# Patient Record
Sex: Female | Born: 1950 | Race: White | Hispanic: No | Marital: Single | Smoking: Never smoker
Health system: Southern US, Community
[De-identification: ages and names within clinical notes are randomized; demographics above are authoritative.]

## PROBLEM LIST (undated history)

## (undated) DIAGNOSIS — M810 Age-related osteoporosis without current pathological fracture: Secondary | ICD-10-CM

## (undated) HISTORY — DX: Age-related osteoporosis without current pathological fracture: M81.0

---

## 1999-02-14 ENCOUNTER — Other Ambulatory Visit: Admission: RE | Admit: 1999-02-14 | Discharge: 1999-02-14 | Payer: Self-pay | Admitting: *Deleted

## 2000-03-20 ENCOUNTER — Other Ambulatory Visit: Admission: RE | Admit: 2000-03-20 | Discharge: 2000-03-20 | Payer: Self-pay | Admitting: *Deleted

## 2001-05-13 ENCOUNTER — Ambulatory Visit (HOSPITAL_COMMUNITY): Admission: RE | Admit: 2001-05-13 | Discharge: 2001-05-13 | Payer: Self-pay | Admitting: *Deleted

## 2001-05-13 ENCOUNTER — Encounter: Payer: Self-pay | Admitting: *Deleted

## 2002-04-20 ENCOUNTER — Other Ambulatory Visit: Admission: RE | Admit: 2002-04-20 | Discharge: 2002-04-20 | Payer: Self-pay | Admitting: *Deleted

## 2005-08-06 ENCOUNTER — Ambulatory Visit (HOSPITAL_COMMUNITY): Admission: RE | Admit: 2005-08-06 | Discharge: 2005-08-06 | Payer: Self-pay | Admitting: Obstetrics

## 2016-04-03 ENCOUNTER — Ambulatory Visit: Payer: Self-pay | Admitting: Certified Nurse Midwife

## 2016-04-03 ENCOUNTER — Ambulatory Visit (INDEPENDENT_AMBULATORY_CARE_PROVIDER_SITE_OTHER): Payer: BLUE CROSS/BLUE SHIELD | Admitting: Certified Nurse Midwife

## 2016-04-03 ENCOUNTER — Encounter: Payer: Self-pay | Admitting: Certified Nurse Midwife

## 2016-04-03 VITALS — BP 129/64 | HR 65 | Temp 98.6°F | Wt 102.0 lb

## 2016-04-03 DIAGNOSIS — Z1389 Encounter for screening for other disorder: Secondary | ICD-10-CM

## 2016-04-03 DIAGNOSIS — M810 Age-related osteoporosis without current pathological fracture: Secondary | ICD-10-CM

## 2016-04-03 DIAGNOSIS — Z01419 Encounter for gynecological examination (general) (routine) without abnormal findings: Secondary | ICD-10-CM

## 2016-04-03 LAB — POCT URINALYSIS DIPSTICK
Bilirubin, UA: NEGATIVE
Blood, UA: NEGATIVE
Glucose, UA: NEGATIVE
Ketones, UA: NEGATIVE
Leukocytes, UA: NEGATIVE
Nitrite, UA: NEGATIVE
Protein, UA: NEGATIVE
Spec Grav, UA: <=1.005
Urobilinogen, UA: NEGATIVE
pH, UA: 8

## 2016-04-03 NOTE — Progress Notes (Signed)
Patient ID: Misty Baird, female   DOB: 1951/11/07, 65 y.o.   MRN: [id number]    Subjective:      Misty Baird is a 65 y.o. female here for a routine exam.  Current complaints: none.  Employed full time.  Post-menopausal April 15th, 2003. Had irritability with HRT.  Exercises regularly.  Does have osteoporosis, well controlled with diet and exercise.  Not currently sexually active.    Personal health questionnaire:  Is patient [name], have a family history of breast and/or ovarian cancer: unsure ?MGM BCA Is there a family history of uterine cancer diagnosed at age < 23, gastrointestinal cancer, urinary tract cancer, family member who is a Field seismologist syndrome-associated carrier: no Is the patient overweight and hypertensive, family history of diabetes, personal history of gestational diabetes, preeclampsia or PCOS: no Is patient over 52, have PCOS,  family history of premature CHD under age 67, diabetes, smoke, have hypertension or peripheral artery disease:  yes At any time, has a partner hit, kicked or otherwise hurt or frightened you?: no Over the past 2 weeks, have you felt down, depressed or hopeless?: no Over the past 2 weeks, have you felt little interest or pleasure in doing things?:no   Gynecologic History No LMP recorded. Patient is postmenopausal. Contraception: abstinence and post menopausal status Last Pap: roughly 11 years ago. Results were: normal according to the patient Last mammogram: unknown ?11 years ago. Results were: normal according to the patient.   Obstetric History OB History  No data available    Past Medical History  Diagnosis Date  . Osteoporosis     R Hip    Past Surgical History  Procedure Laterality Date  . Cesarean section      38 yrs ago     Current outpatient prescriptions:  .  aspirin 81 MG tablet, Take 81 mg by mouth every other day., Disp: , Rfl:  No Known Allergies  Social History  Substance Use Topics  . Smoking status: Never  Smoker   . Smokeless tobacco: Not on file  . Alcohol Use: 0.0 oz/week    0 Standard drinks or equivalent per week     Comment: Occasionally    Family History  Problem Relation Age of Onset  . Other Mother     Septic Bowel  . Cancer Maternal Grandmother   . Stroke Maternal Grandfather       Review of Systems  Constitutional: negative for fatigue and weight loss Respiratory: negative for cough and wheezing Cardiovascular: negative for chest pain, fatigue and palpitations Gastrointestinal: negative for abdominal pain and change in bowel habits Musculoskeletal:negative for myalgias Neurological: negative for gait problems and tremors Behavioral/Psych: negative for abusive relationship, depression Endocrine: negative for temperature intolerance   Genitourinary:negative for abnormal menstrual periods, genital lesions, hot flashes, sexual problems and vaginal discharge Integument/breast: negative for breast lump, breast tenderness, nipple discharge and skin lesion(s)    Objective:       BP 129/64 mmHg  Pulse 65  Temp(Src) 98.6 F (37 C)  Wt 102 lb (46.267 kg) General:   alert  Skin:   no rash or abnormalities  Lungs:   clear to auscultation bilaterally  Heart:   regular rate and rhythm, S1, S2 normal, no murmur, click, rub or gallop  Breasts:   normal without suspicious masses, skin or nipple changes or axillary nodes  Abdomen:  normal findings: no organomegaly, soft, non-tender and no hernia  Pelvis:  External genitalia: normal general appearance Urinary system: urethral meatus normal  and bladder without fullness, nontender Vaginal: normal without tenderness, induration or masses, no ruguae Cervix: normal appearance, atrophy Adnexa: normal bimanual exam Uterus: anteverted and non-tender, normal size   Lab Review Urine pregnancy test Labs reviewed yes Radiologic studies reviewed no  50% of 30 min visit spent on counseling and coordination of care.   Assessment:     Healthy female exam.   Post menopausal  Plan:    Education reviewed: calcium supplements, depression evaluation, low fat, low cholesterol diet, safe sex/STD prevention, self breast exams, skin cancer screening and weight bearing exercise. Contraception: post menopausal status. Mammogram ordered.  Colonoscopy ordered.   Up to date on bone scan.   >27 years of age, no hx of abnormal pap smears, discharged from GYN care as long as Pap smear is normal.    Meds ordered this encounter  Medications  . aspirin 81 MG tablet    Sig: Take 81 mg by mouth every other day.   Orders Placed This Encounter  Procedures  . MM DIGITAL SCREENING BILATERAL    Standing Status: Future     Number of Occurrences:      Standing Expiration Date: 06/03/2017    Order Specific Question:  Reason for Exam (SYMPTOM  OR DIAGNOSIS REQUIRED)    Answer:  annual exam    Order Specific Question:  Preferred imaging location?    Answer:  [facility]  . NuSwab Vaginitis (VG)  . Ambulatory referral to Gastroenterology    Referral Priority:  Routine    Referral Type:  Consultation    Referral Reason:  Specialty Services Required    Number of Visits Requested:  1  . Ambulatory referral to Internal Medicine    Referral Priority:  Routine    Referral Type:  Consultation    Referral Reason:  Specialty Services Required    Requested Specialty:  Internal Medicine    Number of Visits Requested:  1  . POCT urinalysis dipstick   Need to obtain previous records

## 2016-04-04 DIAGNOSIS — M810 Age-related osteoporosis without current pathological fracture: Secondary | ICD-10-CM | POA: Insufficient documentation

## 2016-04-07 LAB — NUSWAB VAGINITIS (VG)
CANDIDA GLABRATA, NAA: NEGATIVE
Candida albicans, NAA: NEGATIVE
Trich vag by NAA: NEGATIVE

## 2016-04-08 LAB — PAP IG AND HPV HIGH-RISK: PAP Smear Comment: 0

## 2016-04-08 LAB — HPV, LOW VOLUME (REFLEX): HPV, LOW VOL REFLEX: NEGATIVE

## 2016-04-24 ENCOUNTER — Ambulatory Visit
Admission: RE | Admit: 2016-04-24 | Discharge: 2016-04-24 | Disposition: A | Discharge status: Home / Self Care | Payer: BLUE CROSS/BLUE SHIELD | Source: Ambulatory Visit | Attending: Certified Nurse Midwife | Admitting: Certified Nurse Midwife

## 2016-04-24 DIAGNOSIS — Z01419 Encounter for gynecological examination (general) (routine) without abnormal findings: Secondary | ICD-10-CM

## 2016-05-05 ENCOUNTER — Encounter: Payer: Self-pay | Admitting: Certified Nurse Midwife

## 2016-06-23 ENCOUNTER — Encounter: Payer: Self-pay | Admitting: Internal Medicine

## 2016-06-23 ENCOUNTER — Ambulatory Visit (INDEPENDENT_AMBULATORY_CARE_PROVIDER_SITE_OTHER): Payer: BLUE CROSS/BLUE SHIELD | Admitting: Internal Medicine

## 2016-06-23 VITALS — BP 130/64 | HR 67 | Temp 98.3°F | Resp 16 | Ht 60.0 in | Wt 103.0 lb

## 2016-06-23 DIAGNOSIS — Z1159 Encounter for screening for other viral diseases: Secondary | ICD-10-CM

## 2016-06-23 DIAGNOSIS — Z0000 Encounter for general adult medical examination without abnormal findings: Secondary | ICD-10-CM | POA: Diagnosis not present

## 2016-06-23 DIAGNOSIS — D333 Benign neoplasm of cranial nerves: Secondary | ICD-10-CM | POA: Insufficient documentation

## 2016-06-23 DIAGNOSIS — M810 Age-related osteoporosis without current pathological fracture: Secondary | ICD-10-CM | POA: Diagnosis not present

## 2016-06-23 NOTE — Progress Notes (Signed)
Pre visit review using our clinic review tool, if applicable. No additional management support is needed unless otherwise documented below in the visit note. 

## 2016-06-23 NOTE — Assessment & Plan Note (Signed)
Recheck dexa Check vitamin d level Advised goal of 1200 mg of calcium daily Stressed exercise Discussed concern with OP and risk of fracture now and in future

## 2016-06-23 NOTE — Assessment & Plan Note (Signed)
Discussed that she should have repeat imaging to re-evaluate the tumor.  Advised that it is slow growing, but some people do need surgery She declined surgery at this time, but will think about it

## 2016-06-23 NOTE — Patient Instructions (Signed)
Test(s) ordered today. Your results will be released to [place] (or called to you) after review, usually within 72hours after test completion. If any changes need to be made, you will be notified at that same time.  All other Health Maintenance issues reviewed.   All recommended immunizations and age-appropriate screenings are up-to-date or discussed.  No immunizations administered today.   Medications reviewed and updated.  No changes recommended at this time.   A dexa was ordered  Please followup in one year for a physical exam.    Health Maintenance, Female Adopting a healthy lifestyle and getting preventive care can go a long way to promote health and wellness. Talk with your health care provider about what schedule of regular examinations is right for you. This is a good chance for you to check in with your provider about disease prevention and staying healthy. In between checkups, there are plenty of things you can do on your own. Experts have done a lot of research about which lifestyle changes and preventive measures are most likely to keep you healthy. Ask your health care provider for more information. WEIGHT AND DIET  Eat a healthy diet  Be sure to include plenty of vegetables, fruits, low-fat dairy products, and lean protein.  Do not eat a lot of foods high in solid fats, added sugars, or salt.  Get regular exercise. This is one of the most important things you can do for your health.  Most adults should exercise for at least 150 minutes each week. The exercise should increase your heart rate and make you sweat (moderate-intensity exercise).  Most adults should also do strengthening exercises at least twice a week. This is in addition to the moderate-intensity exercise.  Maintain a healthy weight  Body mass index (BMI) is a measurement that can be used to identify possible weight problems. It estimates body fat based on height and weight. Your health care provider can  help determine your BMI and help you achieve or maintain a healthy weight.  For females 87 years of age and older:   A BMI below 18.5 is considered underweight.  A BMI of 18.5 to 24.9 is normal.  A BMI of 25 to 29.9 is considered overweight.  A BMI of 30 and above is considered obese.  Watch levels of cholesterol and blood lipids  You should start having your blood tested for lipids and cholesterol at 67 years of age, then have this test every 5 years.  You may need to have your cholesterol levels checked more often if:  Your lipid or cholesterol levels are high.  You are older than 67 years of age.  You are at high risk for heart disease.  CANCER SCREENING   Lung Cancer  Lung cancer screening is recommended for adults 56-3 years old who are at high risk for lung cancer because of a history of smoking.  A yearly low-dose CT scan of the lungs is recommended for people who:  Currently smoke.  Have quit within the past 15 years.  Have at least a 30-pack-year history of smoking. A pack year is smoking an average of one pack of cigarettes a day for 1 year.  Yearly screening should continue until it has been 15 years since you quit.  Yearly screening should stop if you develop a health problem that would prevent you from having lung cancer treatment.  Breast Cancer  Practice breast self-awareness. This means understanding how your breasts normally appear and feel.  It also  means doing regular breast self-exams. Let your health care provider know about any changes, no matter how small.  If you are in your 20s or 30s, you should have a clinical breast exam (CBE) by a health care provider every 1-3 years as part of a regular health exam.  If you are 32 or older, have a CBE every year. Also consider having a breast X-ray (mammogram) every year.  If you have a family history of breast cancer, talk to your health care provider about genetic screening.  If you are at high  risk for breast cancer, talk to your health care provider about having an MRI and a mammogram every year.  Breast cancer gene (BRCA) assessment is recommended for women who have family members with BRCA-related cancers. BRCA-related cancers include:  Breast.  Ovarian.  Tubal.  Peritoneal cancers.  Results of the assessment will determine the need for genetic counseling and BRCA1 and BRCA2 testing. Cervical Cancer Your health care provider may recommend that you be screened regularly for cancer of the pelvic organs (ovaries, uterus, and vagina). This screening involves a pelvic examination, including checking for microscopic changes to the surface of your cervix (Pap test). You may be encouraged to have this screening done every 3 years, beginning at age 25.  For women ages 108-65, health care providers may recommend pelvic exams and Pap testing every 3 years, or they may recommend the Pap and pelvic exam, combined with testing for human papilloma virus (HPV), every 5 years. Some types of HPV increase your risk of cervical cancer. Testing for HPV may also be done on women of any age with unclear Pap test results.  Other health care providers may not recommend any screening for nonpregnant women who are considered low risk for pelvic cancer and who do not have symptoms. Ask your health care provider if a screening pelvic exam is right for you.  If you have had past treatment for cervical cancer or a condition that could lead to cancer, you need Pap tests and screening for cancer for at least 20 years after your treatment. If Pap tests have been discontinued, your risk factors (such as having a new sexual partner) need to be reassessed to determine if screening should resume. Some women have medical problems that increase the chance of getting cervical cancer. In these cases, your health care provider may recommend more frequent screening and Pap tests. Colorectal Cancer  This type of cancer can  be detected and often prevented.  Routine colorectal cancer screening usually begins at 67 years of age and continues through 67 years of age.  Your health care provider may recommend screening at an earlier age if you have risk factors for colon cancer.  Your health care provider may also recommend using home test kits to check for hidden blood in the stool.  A small camera at the end of a tube can be used to examine your colon directly (sigmoidoscopy or colonoscopy). This is done to check for the earliest forms of colorectal cancer.  Routine screening usually begins at age 107.  Direct examination of the colon should be repeated every 5-10 years through 67 years of age. However, you may need to be screened more often if early forms of precancerous polyps or small growths are found. Skin Cancer  Check your skin from head to toe regularly.  Tell your health care provider about any new moles or changes in moles, especially if there is a change in a mole's shape  or color.  Also tell your health care provider if you have a mole that is larger than the size of a pencil eraser.  Always use sunscreen. Apply sunscreen liberally and repeatedly throughout the day.  Protect yourself by wearing long sleeves, pants, a wide-brimmed hat, and sunglasses whenever you are outside. HEART DISEASE, DIABETES, AND HIGH BLOOD PRESSURE   High blood pressure causes heart disease and increases the risk of stroke. High blood pressure is more likely to develop in:  People who have blood pressure in the high end of the normal range (130-139/85-89 mm Hg).  People who are overweight or obese.  People who are African American.  If you are 58-28 years of age, have your blood pressure checked every 3-5 years. If you are 33 years of age or older, have your blood pressure checked every year. You should have your blood pressure measured twice--once when you are at a hospital or clinic, and once when you are not at a  hospital or clinic. Record the average of the two measurements. To check your blood pressure when you are not at a hospital or clinic, you can use:  An automated blood pressure machine at a pharmacy.  A home blood pressure monitor.  If you are between 17 years and 64 years old, ask your health care provider if you should take aspirin to prevent strokes.  Have regular diabetes screenings. This involves taking a blood sample to check your fasting blood sugar level.  If you are at a normal weight and have a low risk for diabetes, have this test once every three years after 67 years of age.  If you are overweight and have a high risk for diabetes, consider being tested at a younger age or more often. PREVENTING INFECTION  Hepatitis B  If you have a higher risk for hepatitis B, you should be screened for this virus. You are considered at high risk for hepatitis B if:  You were born in a country where hepatitis B is common. Ask your health care provider which countries are considered high risk.  Your parents were born in a high-risk country, and you have not been immunized against hepatitis B (hepatitis B vaccine).  You have HIV or AIDS.  You use needles to inject street drugs.  You live with someone who has hepatitis B.  You have had sex with someone who has hepatitis B.  You get hemodialysis treatment.  You take certain medicines for conditions, including cancer, organ transplantation, and autoimmune conditions. Hepatitis C  Blood testing is recommended for:  Everyone born from [birth year] through 1967.  Anyone with known risk factors for hepatitis C. Sexually transmitted infections (STIs)  You should be screened for sexually transmitted infections (STIs) including gonorrhea and chlamydia if:  You are sexually active and are younger than 67 years of age.  You are older than 67 years of age and your health care provider tells you that you are at risk for this type of  infection.  Your sexual activity has changed since you were last screened and you are at an increased risk for chlamydia or gonorrhea. Ask your health care provider if you are at risk.  If you do not have HIV, but are at risk, it may be recommended that you take a prescription medicine daily to prevent HIV infection. This is called pre-exposure prophylaxis (PrEP). You are considered at risk if:  You are sexually active and do not regularly use condoms or know the HIV  status of your partner(s).  You take drugs by injection.  You are sexually active with a partner who has HIV. Talk with your health care provider about whether you are at high risk of being infected with HIV. If you choose to begin PrEP, you should first be tested for HIV. You should then be tested every 3 months for as long as you are taking PrEP.  PREGNANCY   If you are premenopausal and you may become pregnant, ask your health care provider about preconception counseling.  If you may become pregnant, take 400 to 800 micrograms (mcg) of folic acid every day.  If you want to prevent pregnancy, talk to your health care provider about birth control (contraception). OSTEOPOROSIS AND MENOPAUSE   Osteoporosis is a disease in which the bones lose minerals and strength with aging. This can result in serious bone fractures. Your risk for osteoporosis can be identified using a bone density scan.  If you are 57 years of age or older, or if you are at risk for osteoporosis and fractures, ask your health care provider if you should be screened.  Ask your health care provider whether you should take a calcium or vitamin D supplement to lower your risk for osteoporosis.  Menopause may have certain physical symptoms and risks.  Hormone replacement therapy may reduce some of these symptoms and risks. Talk to your health care provider about whether hormone replacement therapy is right for you.  HOME CARE INSTRUCTIONS   Schedule regular  health, dental, and eye exams.  Stay current with your immunizations.   Do not use any tobacco products including cigarettes, chewing tobacco, or electronic cigarettes.  If you are pregnant, do not drink alcohol.  If you are breastfeeding, limit how much and how often you drink alcohol.  Limit alcohol intake to no more than 1 drink per day for nonpregnant women. One drink equals 12 ounces of beer, 5 ounces of wine, or 1 ounces of hard liquor.  Do not use street drugs.  Do not share needles.  Ask your health care provider for help if you need support or information about quitting drugs.  Tell your health care provider if you often feel depressed.  Tell your health care provider if you have ever been abused or do not feel safe at home.   This information is not intended to replace advice given to you by your health care provider. Make sure you discuss any questions you have with your health care provider.   Document Released: 06/02/2011 Document Revised: 12/08/2014 Document Reviewed: 10/19/2013 Elsevier Interactive Patient Education [employer].

## 2016-06-23 NOTE — Progress Notes (Signed)
Subjective:    Patient ID: Misty Baird, female    DOB: 07/04/1951, 65 y.o.   MRN: [id number]  HPI She is here to establish with a new pcp.  She is here for a physical exam.    She has not seen pcp in years.   OP:  Her last dexa was several years ago.  She took actonel for three months and it caused hip pain so she stopped it.  She is not taking calcium or vitamin d.  She is doing some exercise.    Acoustic neuroma:  She has a history of an acoustic neuroma that was diagnosed in 2002.  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  She has not had follow up imaging since 2002.    She has no concerns or questions.   Medications and allergies reviewed with patient and updated if appropriate.  Patient Active Problem List   Diagnosis Date Noted  . 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 Osteoporosis 04/04/2016    Current Outpatient Prescriptions on File Prior to Visit  Medication Sig Dispense Refill  . aspirin 81 MG tablet Take 81 mg by mouth every other day.     No current facility-administered medications on file prior to visit.     Past Medical History:  Diagnosis Date  . Osteoporosis    R Hip    Past Surgical History:  Procedure Laterality Date  . CESAREAN SECTION     38 yrs ago    Social History   Social History  . Marital status: Single    Spouse name: N/A  . Number of children: N/A  . Years of education: N/A   Social History Main Topics  . Smoking status: Never Smoker  . Smokeless tobacco: Never Used  . Alcohol use 0.0 oz/week     Comment: Occasionally  . Drug use: No  . Sexual activity: No   Other Topics Concern  . None   Social History Narrative  . None    Family History  Problem Relation Age of Onset  . Other Mother     Septic Bowel  . Cancer Maternal Grandmother   . Stroke Maternal Grandfather     Review of Systems  Constitutional: Negative for appetite change, chills, fatigue, fever and unexpected weight change.  Eyes:  Negative for visual disturbance.  Respiratory: Negative for cough, shortness of breath and wheezing.   Cardiovascular: Negative for chest pain, palpitations and leg swelling.  Gastrointestinal: Negative for abdominal pain, blood in stool, constipation, diarrhea and nausea.       Rare GERD  Genitourinary: Negative for dysuria and hematuria.  Musculoskeletal: Negative for arthralgias and back pain.  Skin: Negative for color change and rash.  Neurological: Negative for dizziness, light-headedness and headaches.  Psychiatric/Behavioral: Negative for dysphoric mood. The patient is not nervous/anxious.        Objective:   Vitals:   06/23/16 1509  BP: 130/64  Pulse: 67  Resp: 16  Temp: 98.3 F (36.8 C)   Filed Weights   06/23/16 1509  Weight: 103 lb (46.7 kg)   Body mass index is 20.12 kg/m.   Physical Exam Constitutional: She appears well-developed and well-nourished. No distress.  HENT:  Head: Normocephalic and atraumatic.  Right Ear: External ear normal. Normal ear canal and TM Left Ear: External ear normal.  Normal ear canal and TM Mouth/Throat: Oropharynx is clear and moist.  Eyes: Conjunctivae and EOM are normal.  Neck: Neck supple. No tracheal  deviation present. No thyromegaly present.  No carotid bruit  Cardiovascular: Normal rate, regular rhythm and normal heart sounds.   No murmur heard.  No edema. Pulmonary/Chest: Effort normal and breath sounds normal. No respiratory distress. She has no wheezes. She has no rales.  Breast: deferred to Gyn Abdominal: Soft. She exhibits no distension. There is no tenderness.  Lymphadenopathy: She has no cervical adenopathy.  Skin: Skin is warm and dry. She is not diaphoretic.  Psychiatric: She has a normal mood and affect. Her behavior is normal.       Assessment & Plan:   Physical exam: Screening blood work ordered Immunizations - discussed vaccines - she deferred them today Colonoscopy - never had one - deferred today by  patient Mammogram  Up to date  Gyn  Up to date  Dexa ordered Eye exams  Up to date  Exercise - exercising Weight  - normal BMI Skin - no concerns Substance abuse  - none  See Problem List for Assessment and Plan of chronic medical problems.

## 2016-08-01 ENCOUNTER — Ambulatory Visit (INDEPENDENT_AMBULATORY_CARE_PROVIDER_SITE_OTHER)
Admission: RE | Admit: 2016-08-01 | Discharge: 2016-08-01 | Disposition: A | Discharge status: Home / Self Care | Payer: BLUE CROSS/BLUE SHIELD | Source: Ambulatory Visit | Attending: Internal Medicine | Admitting: Internal Medicine

## 2016-08-01 DIAGNOSIS — M810 Age-related osteoporosis without current pathological fracture: Secondary | ICD-10-CM

## 2016-08-05 DIAGNOSIS — M810 Age-related osteoporosis without current pathological fracture: Secondary | ICD-10-CM | POA: Diagnosis not present

## 2016-08-08 ENCOUNTER — Encounter: Payer: Self-pay | Admitting: Physician Assistant

## 2016-08-08 ENCOUNTER — Ambulatory Visit (INDEPENDENT_AMBULATORY_CARE_PROVIDER_SITE_OTHER): Payer: BLUE CROSS/BLUE SHIELD

## 2016-08-08 ENCOUNTER — Ambulatory Visit (INDEPENDENT_AMBULATORY_CARE_PROVIDER_SITE_OTHER): Payer: BLUE CROSS/BLUE SHIELD | Admitting: Family Medicine

## 2016-08-08 VITALS — BP 130/70 | HR 68 | Temp 98.0°F | Resp 16 | Ht 60.0 in | Wt 101.8 lb

## 2016-08-08 DIAGNOSIS — M792 Neuralgia and neuritis, unspecified: Secondary | ICD-10-CM

## 2016-08-08 DIAGNOSIS — Z79899 Other long term (current) drug therapy: Secondary | ICD-10-CM | POA: Diagnosis not present

## 2016-08-08 DIAGNOSIS — M549 Dorsalgia, unspecified: Secondary | ICD-10-CM

## 2016-08-08 DIAGNOSIS — M546 Pain in thoracic spine: Secondary | ICD-10-CM

## 2016-08-08 LAB — GLUCOSE, POCT (MANUAL RESULT ENTRY): POC Glucose: 92 mg/dl (ref 70–99)

## 2016-08-08 MED ORDER — PREDNISONE 20 MG PO TABS: ORAL_TABLET | ORAL | 0 refills | Status: DC

## 2016-08-08 MED ORDER — HYDROCODONE-ACETAMINOPHEN 5-325 MG PO TABS: 1.0000 | ORAL_TABLET | Freq: Four times a day (QID) | ORAL | 0 refills | Status: DC | PRN

## 2016-08-08 NOTE — Progress Notes (Addendum)
Subjective:  By signing my name below, I, Moises Blood, attest that this documentation has been prepared under the direction and in the presence of Merri Ray, MD. Electronically Signed: Moises Blood, [place]. 08/08/2016 , 8:56 AM .  Patient was seen in Room 10 .   Patient ID: Misty Baird, female    DOB: 06-29-1951, 65 y.o.   MRN: [id number] Chief Complaint  Patient presents with  . Shoulder Pain    SINCE LAST SATURDAY   HPI Misty Baird is a 65 y.o. female  Here for constant left shoulder pain that started 6 days ago. Patient states she was playing with her grandchildren 6 days ago and may have picked them up wrong. She notes the pain located under her left shoulder blade and has progressively worsened. She describes the pain being "a hot poker under her left shoulder blade" with intermittent pain radiating into her neck and down her left arm to her hand. She's been alternating between 2 tablets of ibuprofen and aspirin every 4~6 hours with some relief. She denies similar injury or pain in the past. She denies prior surgery to the area. She denies shortness of breath, chest pain, chest tightness, or leg swelling. She denies history of DVT's. She denies history of shoulder fracture. She denies any recent long distance travel.   She's right hand dominant.  She had a bone density done last week and was informed to have osteoporosis in her hip.   Patient Active Problem List   Diagnosis Date Noted  . 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 Osteoporosis 04/04/2016   Past Medical History:  Diagnosis Date  . Osteoporosis    R Hip   Past Surgical History:  Procedure Laterality Date  . CESAREAN SECTION     38 yrs ago   No Known Allergies Prior to Admission medications   Medication Sig Start Date End Date Taking? Authorizing Provider  aspirin 325 MG EC tablet Take 325 mg by mouth daily.   Yes Historical Provider, MD  aspirin 81 MG tablet Take 81 mg by mouth every other day.     Historical Provider, MD   Social History   Social History  . Marital status: Single    Spouse name: N/A  . Number of children: N/A  . Years of education: N/A   Occupational History  . Not on file.   Social History Main Topics  . Smoking status: Never Smoker  . Smokeless tobacco: Never Used  . Alcohol use 0.0 oz/week     Comment: Occasionally  . Drug use: No  . Sexual activity: No   Other Topics Concern  . Not on file   Social History Narrative  . No narrative on file   Review of Systems  Constitutional: Negative for chills, fatigue, fever and unexpected weight change.  Respiratory: Negative for cough, chest tightness and shortness of breath.   Cardiovascular: Negative for chest pain and leg swelling.  Gastrointestinal: Negative for constipation, diarrhea, nausea and vomiting.  Musculoskeletal: Positive for arthralgias, myalgias and neck pain.  Skin: Negative for rash and wound.  Neurological: Negative for dizziness, weakness and headaches.       Objective:   Physical Exam  Constitutional: She is oriented to person, place, and time. She appears well-developed and well-nourished. No distress.  HENT:  Head: Normocephalic and atraumatic.  Eyes: EOM are normal. Pupils are equal, round, and reactive to light.  Neck: Neck supple.  Cardiovascular: Normal rate, regular rhythm and normal heart sounds.  Exam  reveals no gallop and no friction rub.   No murmur heard. Pulmonary/Chest: Effort normal and breath sounds normal. No respiratory distress.  Musculoskeletal: Normal range of motion.  Unable to reproduce pain with c-spine ROM, tenderness along lower rhomboid in her back, no focal bony tenderness, shoulder full ROM, full RTC strength, negative neers, negative hawkin's, negative empty can  Neurological: She is alert and oriented to person, place, and time.  Skin: Skin is warm and dry.  Psychiatric: She has a normal mood and affect. Her behavior is normal.  Nursing note and  vitals reviewed.   Vitals:   08/08/16 0829  BP: 130/70  Pulse: 68  Resp: 16  Temp: 98 F (36.7 C)  TempSrc: Oral  SpO2: 99%  Weight: 101 lb 12.8 oz (46.2 kg)  Height: 5' (1.524 m)    Results for orders placed or performed in visit on 08/08/16  POCT glucose (manual entry)  Result Value Ref Range   POC Glucose 92 70 - 99 mg/dl    Dg Chest 2 View  Result Date: 08/08/2016 CLINICAL DATA:  Left-sided chest pain. EXAM: CHEST  2 VIEW COMPARISON:  None. FINDINGS: The heart size and mediastinal contours are within normal limits. Both lungs are clear. No pneumothorax or pleural effusion is noted. The visualized skeletal structures are unremarkable. IMPRESSION: No active cardiopulmonary disease. Electronically Signed   By: Marijo Conception, M.D.   On: 08/08/2016 09:32   Dg Cervical Spine 2 Or 3 Views  Result Date: 08/08/2016 CLINICAL DATA:  Neck pain EXAM: CERVICAL SPINE - 2-3 VIEW COMPARISON:  None. FINDINGS: Degenerative disc disease from C4-5 thru C6-7, most pronounced at C5-6 and C6-7 with disc space narrowing and spurring. Degenerative facet disease bilaterally. Degenerative anterolisthesis of C3 on C4 of 3 mm. No fracture. Prevertebral soft tissues are normal. IMPRESSION: Degenerative changes as above.  No acute findings. Electronically Signed   By: Rolm Baptise M.D.   On: 08/08/2016 09:30       Assessment & Plan:    Misty Baird is a 66 y.o. female Upper back pain on left side - Plan: DG Chest 2 View, DG Cervical Spine 2 or 3 views, predniSONE (DELTASONE) 20 MG tablet, HYDROcodone-acetaminophen (NORCO/VICODIN) 5-325 MG tablet  Radicular pain in left arm - Plan: DG Chest 2 View, DG Cervical Spine 2 or 3 views, predniSONE (DELTASONE) 20 MG tablet, HYDROcodone-acetaminophen (NORCO/VICODIN) 5-325 MG tablet  High risk medication use - Plan: POCT glucose (manual entry)  Suspected cervical radiculopathy with underlying degenerative disc disease. Strength and reflexes intact.   -Initial  trial of prednisone as she has been taking NSAIDs without relief for the past week. Side effects discussed, hydrocodone if needed every 6 hours. Side effects discussed.   -Out of work note for today, recheck within the next 1 week if not improving, sooner if worse. Next step would possibly be MRI or orthopedic eval.  Meds ordered this encounter  Medications  . aspirin 325 MG EC tablet    Sig: Take 325 mg by mouth daily.  . predniSONE (DELTASONE) 20 MG tablet    Sig: 3 tabs by mouth each day for 2 days, 2 tabs by mouth each day for 2 days, 1 tab by mouth each day for 2 days, 1/2 tab by mouth each day for 2 days.    Dispense:  13 tablet    Refill:  0  . HYDROcodone-acetaminophen (NORCO/VICODIN) 5-325 MG tablet    Sig: Take 1 tablet by mouth every 6 (six) hours  as needed for moderate pain.    Dispense:  15 tablet    Refill:  0   Patient Instructions    Start prednisone for possible pinched nerve from your neck. Hydrocodone up to every 6 hours as needed. Do not drive or operate machinery while taking this medication. If you're not improving within the next 5-7 days, I would recommend an MRI of your neck, or evaluation with orthopedist. Follow up sooner if worse.   Return to the clinic or go to the nearest emergency room if any of your symptoms worsen or new symptoms occur.   Cervical Radiculopathy Cervical radiculopathy happens when a nerve in the neck (cervical nerve) is pinched or bruised. This condition can develop because of an injury or as part of the normal aging process. Pressure on the cervical nerves can cause pain or numbness that runs from the neck all the way down into the arm and fingers. Usually, this condition gets better with rest. Treatment may be needed if the condition does not improve.  CAUSES This condition may be caused by:  Injury.  Slipped (herniated) disk.  Muscle tightness in the neck because of overuse.  Arthritis.  Breakdown or degeneration in the bones  and joints of the spine (spondylosis) due to aging.  Bone spurs that may develop near the cervical nerves. SYMPTOMS Symptoms of this condition include:  Pain that runs from the neck to the arm and hand. The pain can be severe or irritating. It may be worse when the neck is moved.  Numbness or weakness in the affected arm and hand. DIAGNOSIS This condition may be diagnosed based on symptoms, medical history, and a physical exam. You may also have tests, including:  X-rays.  CT scan.  MRI.  Electromyogram (EMG).  Nerve conduction tests. TREATMENT In many cases, treatment is not needed for this condition. With rest, the condition usually gets better over time. If treatment is needed, options may include:  Wearing a soft neck collar for short periods of time.  Physical therapy to strengthen your neck muscles.  Medicines, such as NSAIDs, oral corticosteroids, or spinal injections.  Surgery. This may be needed if other treatments do not help. Various types of surgery may be done depending on the cause of your problems. HOME CARE INSTRUCTIONS Managing Pain  Take over-the-counter and prescription medicines only as told by your health care provider.  If directed, apply ice to the affected area.  Put ice in a plastic bag.  Place a towel between your skin and the bag.  Leave the ice on for 20 minutes, 2-3 times per day.  If ice does not help, you can try using heat. Take a warm shower or warm bath, or use a heat pack as told by your health care provider.  Try a gentle neck and shoulder massage to help relieve symptoms. Activity  Rest as needed. Follow instructions from your health care provider about any restrictions on activities.  Do stretching and strengthening exercises as told by your health care provider or physical therapist. General Instructions  If you were given a soft collar, wear it as told by your health care provider.  Use a flat pillow when you  sleep.  Keep all follow-up visits as told by your health care provider. This is important. SEEK MEDICAL CARE IF:  Your condition does not improve with treatment. SEEK IMMEDIATE MEDICAL CARE IF:  Your pain gets much worse and cannot be controlled with medicines.  You have weakness or numbness  in your hand, arm, face, or leg.  You have a high fever.  You have a stiff, rigid neck.  You lose control of your bowels or your bladder (have incontinence).  You have trouble with walking, balance, or speaking.   This information is not intended to replace advice given to you by your health care provider. Make sure you discuss any questions you have with your health care provider.   Document Released: 08/12/2001 Document Revised: 08/08/2015 Document Reviewed: 01/11/2015 Elsevier Interactive Patient Education [employer].    IF you received an x-ray today, you will receive an invoice from [facility]. Please contact [facility] at [phone] with questions or concerns regarding your invoice.   IF you received labwork today, you will receive an invoice from [employer]. Please contact [facility] at [phone] with questions or concerns regarding your invoice.   Our billing staff will not be able to assist you with questions regarding bills from these companies.  You will be contacted with the lab results as soon as they are available. The fastest way to get your results is to activate your My Chart account. Instructions are located on the last page of this paperwork. If you have not heard from [place] regarding the results in 2 weeks, please contact this office.       I personally performed the services described in this documentation, which was scribed in my presence. The recorded information has been reviewed and considered, and addended by me as needed.   Signed,   Merri Ray, MD [facility].  08/08/16 9:55 AM

## 2016-08-08 NOTE — Patient Instructions (Addendum)
Start prednisone for possible pinched nerve from your neck. Hydrocodone up to every 6 hours as needed. Do not drive or operate machinery while taking this medication. If you're not improving within the next 5-7 days, I would recommend an MRI of your neck, or evaluation with orthopedist. Follow up sooner if worse.   Return to the clinic or go to the nearest emergency room if any of your symptoms worsen or new symptoms occur.   Cervical Radiculopathy Cervical radiculopathy happens when a nerve in the neck (cervical nerve) is pinched or bruised. This condition can develop because of an injury or as part of the normal aging process. Pressure on the cervical nerves can cause pain or numbness that runs from the neck all the way down into the arm and fingers. Usually, this condition gets better with rest. Treatment may be needed if the condition does not improve.  CAUSES This condition may be caused by:  Injury.  Slipped (herniated) disk.  Muscle tightness in the neck because of overuse.  Arthritis.  Breakdown or degeneration in the bones and joints of the spine (spondylosis) due to aging.  Bone spurs that may develop near the cervical nerves. SYMPTOMS Symptoms of this condition include:  Pain that runs from the neck to the arm and hand. The pain can be severe or irritating. It may be worse when the neck is moved.  Numbness or weakness in the affected arm and hand. DIAGNOSIS This condition may be diagnosed based on symptoms, medical history, and a physical exam. You may also have tests, including:  X-rays.  CT scan.  MRI.  Electromyogram (EMG).  Nerve conduction tests. TREATMENT In many cases, treatment is not needed for this condition. With rest, the condition usually gets better over time. If treatment is needed, options may include:  Wearing a soft neck collar for short periods of time.  Physical therapy to strengthen your neck muscles.  Medicines, such as NSAIDs, oral  corticosteroids, or spinal injections.  Surgery. This may be needed if other treatments do not help. Various types of surgery may be done depending on the cause of your problems. HOME CARE INSTRUCTIONS Managing Pain  Take over-the-counter and prescription medicines only as told by your health care provider.  If directed, apply ice to the affected area.  Put ice in a plastic bag.  Place a towel between your skin and the bag.  Leave the ice on for 20 minutes, 2-3 times per day.  If ice does not help, you can try using heat. Take a warm shower or warm bath, or use a heat pack as told by your health care provider.  Try a gentle neck and shoulder massage to help relieve symptoms. Activity  Rest as needed. Follow instructions from your health care provider about any restrictions on activities.  Do stretching and strengthening exercises as told by your health care provider or physical therapist. General Instructions  If you were given a soft collar, wear it as told by your health care provider.  Use a flat pillow when you sleep.  Keep all follow-up visits as told by your health care provider. This is important. SEEK MEDICAL CARE IF:  Your condition does not improve with treatment. SEEK IMMEDIATE MEDICAL CARE IF:  Your pain gets much worse and cannot be controlled with medicines.  You have weakness or numbness in your hand, arm, face, or leg.  You have a high fever.  You have a stiff, rigid neck.  You lose control of your  bowels or your bladder (have incontinence).  You have trouble with walking, balance, or speaking.   This information is not intended to replace advice given to you by your health care provider. Make sure you discuss any questions you have with your health care provider.   Document Released: 08/12/2001 Document Revised: 08/08/2015 Document Reviewed: 01/11/2015 Elsevier Interactive Patient Education [employer].    IF you received an x-ray today,  you will receive an invoice from [facility]. Please contact [facility] at [phone] with questions or concerns regarding your invoice.   IF you received labwork today, you will receive an invoice from [employer]. Please contact [facility] at [phone] with questions or concerns regarding your invoice.   Our billing staff will not be able to assist you with questions regarding bills from these companies.  You will be contacted with the lab results as soon as they are available. The fastest way to get your results is to activate your My Chart account. Instructions are located on the last page of this paperwork. If you have not heard from [place] regarding the results in 2 weeks, please contact this office.

## 2016-08-09 ENCOUNTER — Telehealth: Payer: Self-pay

## 2016-08-09 ENCOUNTER — Other Ambulatory Visit: Payer: Self-pay | Admitting: Radiology

## 2016-08-09 NOTE — Telephone Encounter (Signed)
I have discussed with patient/ and her daughter. Advised to d/c Hydrocodone, use Tylenol. Try moist heat to her neck/ upper back. Advised to give prednisone time to work, use Benadryl prn 25 mg, to see if this will help some since she has had trouble with hydrocodone making her "feel bad" Sedation warning provided with Benadryl. To you FYI

## 2016-08-09 NOTE — Telephone Encounter (Signed)
Pt was seen yesterday and the hydrocodone is making her sick And would like something different  Best number [phone]

## 2016-08-09 NOTE — Telephone Encounter (Signed)
Agree.  Stop hydrocodone, and tylenol is ok. Prednisone may start to work today or tomorrow. If still requiring stronger pain med into Monday, could consider tramadol with food.

## 2016-08-12 ENCOUNTER — Telehealth: Payer: Self-pay | Admitting: Emergency Medicine

## 2016-08-12 NOTE — Telephone Encounter (Signed)
Pt states, the pain is easing up with taking otc extra strength Arthritis pills. Declined Tramadol for now due to side effect after taking Vicodin. Instructed pt to call or return to clinic if pain worsens.

## 2016-08-19 ENCOUNTER — Encounter: Payer: Self-pay | Admitting: Internal Medicine

## 2016-08-19 ENCOUNTER — Ambulatory Visit (INDEPENDENT_AMBULATORY_CARE_PROVIDER_SITE_OTHER): Payer: BLUE CROSS/BLUE SHIELD | Admitting: Internal Medicine

## 2016-08-19 VITALS — BP 124/72 | HR 67 | Temp 98.0°F | Resp 16 | Wt 103.0 lb

## 2016-08-19 DIAGNOSIS — M549 Dorsalgia, unspecified: Secondary | ICD-10-CM | POA: Diagnosis not present

## 2016-08-19 DIAGNOSIS — M5010 Cervical disc disorder with radiculopathy, unspecified cervical region: Secondary | ICD-10-CM

## 2016-08-19 DIAGNOSIS — M810 Age-related osteoporosis without current pathological fracture: Secondary | ICD-10-CM | POA: Diagnosis not present

## 2016-08-19 MED ORDER — OXYCODONE-ACETAMINOPHEN 5-325 MG PO TABS: 1.0000 | ORAL_TABLET | Freq: Three times a day (TID) | ORAL | 0 refills | Status: AC | PRN

## 2016-08-19 MED ORDER — METHOCARBAMOL 500 MG PO TABS: 500.0000 mg | ORAL_TABLET | Freq: Three times a day (TID) | ORAL | 0 refills | Status: DC | PRN

## 2016-08-19 NOTE — Progress Notes (Signed)
Pre visit review using our clinic review tool, if applicable. No additional management support is needed unless otherwise documented below in the visit note. 

## 2016-08-19 NOTE — Assessment & Plan Note (Signed)
Her mid back pain seems muscular in nature We'll try muscle relaxer-metacarpal Oxycodone for severe pain only Continue ibuprofen Consider physical therapy Call if no improvement

## 2016-08-19 NOTE — Patient Instructions (Addendum)
Calcium citrate 600 mg twice a day with food.  Vitamin d 1000 unit daily.  Regular exercise is important to do, such as walking, zumba, pilates and weight lifting.    Let me know if you want to start the fosamax. It would be one a week.      Try the muscle relaxer - methocarbamol.    The pain medication use only as needed and take with food.   Call if no improvement.

## 2016-08-19 NOTE — Assessment & Plan Note (Signed)
Radiating pain down left arm associated with numbness/tingling and weakness-intermittent symptoms No significant neck pain We'll try muscle relaxer and oxycodone-acetaminophen Continue ibuprofen If no improvement recommended physical therapy and possibly imaging of her neck Advised her to call if there is no improvement

## 2016-08-19 NOTE — Assessment & Plan Note (Addendum)
Reviewed dexa Start calcium and vitamin d daily - advised amount Discussed treatment options - she will let me know Will consider fosamax Continue regular exercise Repeat DEXA in 2 years, sooner if she starts treatment

## 2016-08-19 NOTE — Progress Notes (Signed)
Subjective:    Patient ID: Misty Baird, female    DOB: [birth year], 65 y.o.   MRN: [id number]  HPI  She is here to discuss osteoporosis and is also still experiencing back and arm pain.  Cervical radiculopathy, left mid back pain: Her pain started the beginning of the month and worsened. She was seen at urgent care and diagnosed with cervical radiculopathy. She was prescribed a prednisone taper and Vicodin. She completed the prednisone and did not feel much improvement. She only took the Vicodin once and it caused her nausea and she vomited the medication. She has not tried it again. She is currently taking ibuprofen, which does not seem to be helping the pain. Ice has helped more than heat. She is still having significant pain, but it is intermittent. She has pain in the left mid back near the scapula. She has pain radiating down the left arm as well as numbness, tingling and weakness in the arm. All of her symptoms are intermittent. She denies any neck pain and has full range of motion of her neck. She denies shoulder pain and has full range of motion of her shoulder.  Osteoporosis: She has a history of osteoporosis and had a recent bone density scan. She was on Actonel, but only took a few times and stopped it due to hip pain. She has not been on any other medication. She is not currently taking any calcium or vitamin D. She typically exercises regularly, but has not been able to exercise as much with her neck pain. She denies any history of fractures.   Medications and allergies reviewed with patient and updated if appropriate.  Patient Active Problem List   Diagnosis Date Noted  . 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 Osteoporosis 04/04/2016    Current Outpatient Prescriptions on File Prior to Visit  Medication Sig Dispense Refill  . aspirin 325 MG EC tablet Take 325 mg by mouth daily.    Marland Kitchen aspirin 81 MG tablet Take 81 mg by mouth every other day.     No current  facility-administered medications on file prior to visit.     Past Medical History:  Diagnosis Date  . Osteoporosis    R Hip    Past Surgical History:  Procedure Laterality Date  . CESAREAN SECTION     38 yrs ago    Social History   Social History  . Marital status: Single    Spouse name: N/A  . Number of children: N/A  . Years of education: N/A   Social History Main Topics  . Smoking status: Never Smoker  . Smokeless tobacco: Never Used  . Alcohol use 0.0 oz/week     Comment: Occasionally  . Drug use: No  . Sexual activity: No   Other Topics Concern  . Not on file   Social History Narrative  . No narrative on file    Family History  Problem Relation Age of Onset  . Other Mother     Septic Bowel  . Cancer Maternal Grandmother   . Stroke Maternal Grandfather     Review of Systems  Musculoskeletal: Positive for myalgias. Negative for arthralgias, neck pain and neck stiffness.  Neurological: Positive for weakness and numbness. Negative for light-headedness and headaches.       Objective:   Vitals:   08/19/16 1535  BP: 124/72  Pulse: 67  Resp: 16  Temp: 98 F (36.7 C)   Filed Weights   08/19/16 1535  Weight:  103 lb (46.7 kg)   Body mass index is 20.12 kg/m.   Physical Exam  Constitutional: She appears well-developed and well-nourished. No distress.  Musculoskeletal:  No neck pain or left shoulder pain, full ROM of neck and left shoulder, pain with palpations left mid back   Neurological:  Normal sensation and strength in b/l UE  Skin: No rash noted. She is not diaphoretic.          Assessment & Plan:   See Problem List for Assessment and Plan of chronic medical problems.

## 2016-08-25 ENCOUNTER — Telehealth: Payer: Self-pay

## 2016-08-25 DIAGNOSIS — M25512 Pain in left shoulder: Secondary | ICD-10-CM

## 2016-08-25 DIAGNOSIS — M79602 Pain in left arm: Secondary | ICD-10-CM

## 2016-08-25 NOTE — Telephone Encounter (Signed)
Please advise 

## 2016-08-25 NOTE — Telephone Encounter (Signed)
Patient states she is still having a lot of pain to her L shoulder down her arm. She states Dr.Burns had said something about some PT and she would like to try it. She states she needs to do something. Please follow up, Thank you.

## 2016-08-25 NOTE — Telephone Encounter (Signed)
Referral ordered.  If no improvement she should let us know so we can refer to sports medicine/ortho

## 2016-08-27 ENCOUNTER — Telehealth: Payer: Self-pay | Admitting: Internal Medicine

## 2016-08-27 NOTE — Telephone Encounter (Signed)
Spoke with pt to inform. Pt states that the pain in "screaming" down her arm. She also is concerned bc she said she has the feeling in her throat like you do when you're trying not to cry. She would like to know if this could be coming from her having to constantly take tylenol. Please advise as Dr Quay Burow is not back until Monday.

## 2016-08-27 NOTE — Telephone Encounter (Signed)
PT order has been submitted and the physical therapy place will call her to schedule.    She should be seen if no improvement or worsening of her symptoms.

## 2016-08-27 NOTE — Telephone Encounter (Signed)
°  Patient Name: Misty Baird  DOB: Apr 19, 1951    Initial Comment Caller is taking muscle relaxer and is having a reaction    Nurse Assessment  Nurse: Julien Girt, RN, [name] Date/Time (Eastern Time): 08/27/2016 9:20:33 AM  Confirm and document reason for call. If symptomatic, describe symptoms. You must click the next button to save text entered. ---Caller states she is taking a muscle relaxer( Robaxin) and she is having a reaction . States her sx began 2 days, she describes a "lump in her throat". She did not take any more since Sunday. Her throat does not feel normal.  Has the patient traveled out of the country within the last 30 days? ---Not Applicable  Does the patient have any new or worsening symptoms? ---Yes  Will a triage be completed? ---Yes  Related visit to physician within the last 2 weeks? ---No  Does the PT have any chronic conditions? (i.e. diabetes, asthma, etc.) ---Yes  List chronic conditions. ---DJD Cervical  Is this a behavioral health or substance abuse call? ---No     Guidelines    Guideline Title Affirmed Question Affirmed Notes  Sore Throat [1] Sore throat is the only symptom AND [2] present > 48 hours    Final Disposition User   See Physician within [place], RN, [name]    Comments  [name] states she does not feel she needs to be seen , she has stopped the Robaxin and will see if her throat improves. Advised to cb as needed. Verbalized understanding. ** SHE IS AWAITING CB REGARDING PT AND CAN BE REACHED AT [phone]   Referrals  GO TO FACILITY REFUSED   Disagree/Comply: Disagree  Disagree/Comply Reason: Wait and see

## 2016-08-27 NOTE — Telephone Encounter (Signed)
Patient has stopped robaxin---do you recommend anything else----I will call patient back

## 2016-08-27 NOTE — Telephone Encounter (Signed)
Patient has already tried prednisone without any real good results---she's already had xrays---I have advised there's really no other medicine to replace what she's tried without being seen in office again, with possible CT scan---patient advised that PT referral is in place so someone from that office should be calling her if she wants to wait to see them---she's already been to urgent care---patient advised to call our office back if she wants to see our NP, charlotte---who will be seeing acute visits on Thursday and Friday, but dr burns is not in office rest of this week---if she sees charlotte, I still cannot guarantee that charlotte would want to have CT scan done, that decision would be left up to charlotte---patient wants to wait on PT to call, she does not want a visit right now, will call back if she changes her mind

## 2016-09-01 ENCOUNTER — Ambulatory Visit: Payer: BLUE CROSS/BLUE SHIELD | Attending: Internal Medicine | Admitting: Physical Therapy

## 2016-09-01 DIAGNOSIS — M25512 Pain in left shoulder: Secondary | ICD-10-CM | POA: Insufficient documentation

## 2016-09-01 DIAGNOSIS — R293 Abnormal posture: Secondary | ICD-10-CM | POA: Diagnosis present

## 2016-09-01 DIAGNOSIS — G8929 Other chronic pain: Secondary | ICD-10-CM | POA: Insufficient documentation

## 2016-09-01 NOTE — Therapy (Signed)
[place], [place], [address] Phone: [phone]   Fax:  [phone]  Physical Therapy Evaluation  Patient Details  Name: Misty Baird MRN: [id number] Date of Birth: [birth year] Referring Provider: Billey Gosling MD  Encounter Date: 09/01/2016      PT End of Session - 09/01/16 1803    Visit Number 1   Number of Visits 13   Date for PT Re-Evaluation 10/13/16   Authorization Type BC[employer]   PT Start Time 1500   PT Stop Time 1547   PT Time Calculation (min) 47 min   Activity Tolerance Patient tolerated treatment well   Behavior During Therapy [facility] for tasks assessed/performed      Past Medical History:  Diagnosis Date  . Osteoporosis    R Hip    Past Surgical History:  Procedure Laterality Date  . CESAREAN SECTION     38 yrs ago    There were no vitals filed for this visit.       Subjective Assessment - 09/01/16 1512    Subjective pt is a 65 y.o F with CC of Neck and L shoulder/ arm pain that started the weekend before labor day when she went to pick up her grandchild. reports since onset the pain has gotten better but can fluctuate, with report of feeling like a hot poker beneath her L shoulder blade. report of N/T in the L hand and all the fingers which is sporatic.     Limitations Lifting   How long can you sit comfortably? unlimited   How long can you stand comfortably? unlimited   How long can you walk comfortably? unlimited   Diagnostic tests x-ray cervical 08/08/2016   Patient Stated Goals want it to go away.    Currently in Pain? Yes   Pain Score 7   took tylenol at 9am   Pain Location Shoulder   Pain Orientation Left   Pain Descriptors / Indicators Pins and needles;Burning   Pain Type Chronic pain   Pain Radiating Towards to the L hand/ fingers   Pain Onset More than a month ago   Pain Frequency Intermittent   Aggravating Factors  getting up, out of bed,    Pain Relieving Factors  laying down, ice, tylenol,    Multiple Pain Sites Yes   Pain Score 0   Pain Location Neck            [facility] PT Assessment - 09/01/16 1519      Assessment   Medical Diagnosis L shoulder/ arm pain   Referring Provider Billey Gosling MD   Onset Date/Surgical Date --  September 2nd, 2017   Hand Dominance Right   Next MD Visit make one PRN   Prior Therapy no     Precautions   Precautions None     Restrictions   Weight Bearing Restrictions No     Balance Screen   Has the patient fallen in the past 6 months No   Has the patient had a decrease in activity level because of a fear of falling?  No   Is the patient reluctant to leave their home because of a fear of falling?  No     Home Environment   Living Environment Private residence   Living Arrangements Alone   Type of [place] to enter   Entrance Stairs-Number of Steps 15   Entrance Stairs-Rails Can reach both   [place] One level  Prior Function   Level of Independence Independent;Independent with basic ADLs   Vocation Full time employment  Education officer, community tasks, standing up/ sitting down,    Leisure Zumba, bycicling, swimming, walking     Cognition   Overall Cognitive Status Within Functional Limits for tasks assessed     Observation/Other Assessments   Focus on Therapeutic Outcomes (FOTO)  28% limited  predicted 27% limited     Posture/Postural Control   Posture/Postural Control Postural limitations   Postural Limitations Rounded Shoulders;Forward head;Decreased lumbar lordosis     ROM / Strength   AROM / PROM / Strength AROM;Strength;PROM     AROM   AROM Assessment Site Shoulder;Cervical   Right/Left Shoulder Right;Left   Right Shoulder Extension 55 Degrees   Right Shoulder Flexion 144 Degrees   Right Shoulder ABduction 140 Degrees   Right Shoulder Internal Rotation 55 Degrees  tested at 90/90   Right Shoulder External Rotation 87  Degrees  tested at 90/90   Left Shoulder Extension 50 Degrees   Left Shoulder Flexion 155 Degrees   Left Shoulder ABduction 155 Degrees   Left Shoulder Internal Rotation 70 Degrees   Left Shoulder External Rotation 85 Degrees   Cervical Flexion 58  pain noted in the L upper trap   Cervical Extension 28   Cervical - Right Side Bend 30   Cervical - Left Side Bend 30   Cervical - Right Rotation 60   Cervical - Left Rotation 60     Strength   Strength Assessment Site Shoulder;Hand   Right/Left Shoulder Left;Right   Right Shoulder Flexion 4/5   Right Shoulder Extension 4/5   Right Shoulder ABduction 4/5   Right Shoulder Internal Rotation 4/5   Right Shoulder External Rotation 4/5   Left Shoulder Flexion 4/5   Left Shoulder Extension 4/5   Left Shoulder ABduction 4/5   Left Shoulder Internal Rotation 4/5   Left Shoulder External Rotation 4/5   Right Hand Grip (lbs) 48.3  50,49,46   Left Hand Grip (lbs) 35  36,35,34     Palpation   Palpation comment tenderness in the L Rhomboids and thoracic paraspinals, tightness in the L upper trap      Special Tests    Special Tests Cervical;Thoracic Outlet Syndrome   Cervical Tests Dictraction;Spurling's;other   Thoracic Outlet Syndrome  Carmelina Paddock Test     Spurling's   Findings Negative     Distraction Test   Findngs Negative     other    Findings Not tested   Comment ULNT     Adson Test   Findings Not tested     [facility] Test   Findings Not tested                           PT Education - 09/01/16 1803    Education provided Yes   Education Details evaluation findings, POC, goals, HEP with proper form and treatment rationale.   Person(s) Educated Patient   Methods Explanation;Verbal cues;Demonstration   Comprehension Verbalized understanding;Returned demonstration;Verbal cues required          PT Short Term Goals - 09/01/16 1811      PT SHORT TERM GOAL #1   Title pt will be I with inital HEP  ( 09/22/2016)   Time 3   Period Weeks   Status New           PT Long Term Goals -  09/01/16 1811      PT LONG TERM GOAL #1   Title pt will be I with all HEP given as of last visit (10/13/2016)   Time 6   Period Weeks   Status New     PT LONG TERM GOAL #2   Title pt will be able to verbalize and demo proper lifting and carrying mechanics and techniques pt prevent and reduce shoulder pain (10/13/2016)   Time 6   Period Weeks   Status New     PT LONG TERM GOAL #3   Title pt will be able to lift/ carry >/= 25# from floor <> waist and from  waist<> shoulder height with </= 2/10 pain utilizing proper form for functional lifting and ADLS ([id number])   Time 6   Period Weeks   Status New     PT LONG TERM GOAL #4   Title pt will report no burning or radiating pain inthe shoulder blade or L upper trap with sitting/ standing for >/= 30 min to assist with work and leisure activities (10/13/2016)   Time 6   Period Weeks   Status New     PT LONG TERM GOAL #5   Title she will increaes her FOTO score to </= 24% limitation to demonstrate improvement in function at discharge (10/13/2016)   Time E. Lopez - 09/01/16 1804    Clinical Impression Statement Mrs. Menear presents to OPPT as a low complexity evaluation with CC of L shoulder/ arm pain that started non-traumatically 4 weeks ago. she exhibits functional shoulder AROM/ PROM and strength. pain noted only in the L medial scapular region noted ans burning that was reduces with correct sitting posture, with some tightness noted in the upper trap / levator scapulae. She would benefit from physical therapy to reduce pain, improve lifting and carrying mechanics, and return to her PLOF by addressing the defecits listed.    Rehab Potential Good   PT Frequency 2x / week   PT Duration 6 weeks   PT Treatment/Interventions ADLs/Self Care Home Management;Cryotherapy;Iontophoresis 4mg /ml  Dexamethasone;Electrical Stimulation;Ultrasound;Therapeutic activities;Therapeutic exercise;Taping;Dry needling;Manual techniques;Passive range of motion;Moist Heat;Patient/family education   PT Next Visit Plan assess/ review HEP, posture education, lifting/ carrying mechanics, thoracic mobility, shoulder strengthening (hx osteoporosis)   PT Home Exercise Plan seated/ standing posture, rows, upper trap stretch/ levator scapulae stretching.    Consulted and Agree with Plan of Care Patient      Patient will benefit from skilled therapeutic intervention in order to improve the following deficits and impairments:  Improper body mechanics, Postural dysfunction, Pain, Decreased activity tolerance, Increased fascial restricitons, Impaired UE functional use, Decreased endurance  Visit Diagnosis: Chronic left shoulder pain - Plan: PT plan of care cert/re-cert  Abnormal posture - Plan: PT plan of care cert/re-cert     Problem List Patient Active Problem List   Diagnosis Date Noted  . Mid back pain on left side 08/19/2016  . Cervical disc disorder with radiculopathy of cervical region 08/19/2016  . 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 Osteoporosis 04/04/2016   Starr Lake PT, DPT, LAT, ATC  09/01/16  6:20 PM      [facility] [address] [place], [place], [address] Phone: [phone]   Fax:  [phone]  Name: Misty Baird MRN: [id number] Date of Birth: Nov 07, 1951

## 2016-09-01 NOTE — Patient Instructions (Signed)

## 2016-09-10 ENCOUNTER — Ambulatory Visit: Payer: BLUE CROSS/BLUE SHIELD | Admitting: Physical Therapy

## 2016-09-10 DIAGNOSIS — M25512 Pain in left shoulder: Secondary | ICD-10-CM | POA: Diagnosis not present

## 2016-09-10 DIAGNOSIS — R293 Abnormal posture: Secondary | ICD-10-CM

## 2016-09-10 DIAGNOSIS — G8929 Other chronic pain: Secondary | ICD-10-CM

## 2016-09-10 NOTE — Therapy (Signed)
[place], [place], [address] Phone: [phone]   Fax:  [phone]  Physical Therapy Treatment  Patient Details  Name: Misty Baird MRN: [id number] Date of Birth: Feb 23, 1951 Referring Provider: Billey Gosling MD  Encounter Date: 09/10/2016      PT End of Session - 09/10/16 0937    Visit Number 2   Number of Visits 13   Date for PT Re-Evaluation 10/13/16   PT Start Time 0845   PT Stop Time 0927   PT Time Calculation (min) 42 min   Activity Tolerance Patient tolerated treatment well   Behavior During Therapy [facility] for tasks assessed/performed      Past Medical History:  Diagnosis Date  . Osteoporosis    R Hip    Past Surgical History:  Procedure Laterality Date  . CESAREAN SECTION     38 yrs ago    There were no vitals filed for this visit.      Subjective Assessment - 09/10/16 0854    Subjective "I am being consistent with my HEP and reported the pain it much better"   Currently in Pain? Yes   Pain Score 5    Pain Location Shoulder   Pain Orientation Left   Pain Descriptors / Indicators Aching   Pain Onset More than a month ago   Pain Frequency Intermittent   Aggravating Factors  getting out of bed   Pain Relieving Factors laying down, ice, tylenol                         [facility] Adult PT Treatment/Exercise - 09/10/16 0900      Shoulder Exercises: Supine   Other Supine Exercises ceiling punches 2 x 10   with 3#     Shoulder Exercises: Seated   Other Seated Exercises pelvic tilts 1 x 10 with 5 sec hold  seated on dynadisc     Shoulder Exercises: Sidelying   Other Sidelying Exercises book opening 2 x 10 bil     Shoulder Exercises: Standing   Extension Strengthening;Both;10 reps;Theraband   Theraband Level (Shoulder Extension) Level 2 (Red)  2 sets   Row Strengthening;Both;10 reps;Theraband  2 sets    Theraband Level (Shoulder Row) Level 2 (Red)     Shoulder  Exercises: ROM/Strengthening   UBE (Upper Arm Bike) L1 x 5 min  changing direction at 2:30      Shoulder Exercises: Stretch   Other Shoulder Stretches upper trap 2 x 30 sec   Other Shoulder Stretches levator scapulae 2x 30, rhomboid 2 x 30 sec                PT Education - 09/10/16 0907    Education provided Yes   Education Details reviewed and upated HEP with proper form and treatment rationale   Person(s) Educated Patient   Methods Explanation;Verbal cues;Handout;Demonstration   Comprehension Verbalized understanding;Verbal cues required;Returned demonstration          PT Short Term Goals - 09/01/16 1811      PT SHORT TERM GOAL #1   Title pt will be I with inital HEP ( 09/22/2016)   Time 3   Period Weeks   Status New           PT Long Term Goals - 09/01/16 1811      PT LONG TERM GOAL #1   Title pt will be I with all HEP given as of last visit (10/13/2016)  Time 6   Period Weeks   Status New     PT LONG TERM GOAL #2   Title pt will be able to verbalize and demo proper lifting and carrying mechanics and techniques pt prevent and reduce shoulder pain (10/13/2016)   Time 6   Period Weeks   Status New     PT LONG TERM GOAL #3   Title pt will be able to lift/ carry >/= 25# from floor <> waist and from  waist<> shoulder height with </= 2/10 pain utilizing proper form for functional lifting and ADLS ([id number])   Time 6   Period Weeks   Status New     PT LONG TERM GOAL #4   Title pt will report no burning or radiating pain inthe shoulder blade or L upper trap with sitting/ standing for >/= 30 min to assist with work and leisure activities (10/13/2016)   Time 6   Period Weeks   Status New     PT LONG TERM GOAL #5   Title she will increaes her FOTO score to </= 24% limitation to demonstrate improvement in function at discharge (10/13/2016)   Time [place] - 09/10/16 [id number]    Clinical Impression  Statement [name] reports being consistent with her HEp and reports pain dropped to 5/10 compared to last session. reviewed HEP and focused on scapular stability exercises and thoracic mobility, which she was able to do without report of increased pain or report of soreness.    PT Next Visit Plan scapular stability,  posture education, lifting/ carrying mechanics, thoracic mobility, shoulder strengthening (hx osteoporosis)   PT Home Exercise Plan seated pelvic tilts, book opening, rhomboid stretch   Consulted and Agree with Plan of Care Patient      Patient will benefit from skilled therapeutic intervention in order to improve the following deficits and impairments:  Improper body mechanics, Postural dysfunction, Pain, Decreased activity tolerance, Increased fascial restricitons, Impaired UE functional use, Decreased endurance  Visit Diagnosis: Chronic left shoulder pain  Abnormal posture     Problem List Patient Active Problem List   Diagnosis Date Noted  . Mid back pain on left side 08/19/2016  . Cervical disc disorder with radiculopathy of cervical region 08/19/2016  . 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 Osteoporosis 04/04/2016   Starr Lake PT, DPT, LAT, ATC  09/10/16  9:43 AM      [facility] [address] [place], [place], [address] Phone: [phone]   Fax:  [phone]  Name: Misty Baird MRN: [id number] Date of Birth: Jan 23, 1951

## 2016-09-15 ENCOUNTER — Ambulatory Visit: Payer: BLUE CROSS/BLUE SHIELD | Admitting: Physical Therapy

## 2016-09-22 ENCOUNTER — Ambulatory Visit: Payer: BLUE CROSS/BLUE SHIELD | Admitting: Physical Therapy

## 2016-09-22 DIAGNOSIS — G8929 Other chronic pain: Secondary | ICD-10-CM

## 2016-09-22 DIAGNOSIS — R293 Abnormal posture: Secondary | ICD-10-CM

## 2016-09-22 DIAGNOSIS — M25512 Pain in left shoulder: Principal | ICD-10-CM

## 2016-09-22 NOTE — Patient Instructions (Addendum)
Over Head Pull: Narrow Grip       On back, knees bent, feet flat, band across thighs, elbows straight but relaxed. Pull hands apart (start). Keeping elbows straight, bring arms up and over head, hands toward floor. Keep pull steady on band. Hold momentarily. Return slowly, keeping pull steady, back to start. Repeat __10_ times. Band color ___yellow___   Side Pull: Double Arm   On back, knees bent, feet flat. Arms perpendicular to body, shoulder level, elbows straight but relaxed. Pull arms out to sides, elbows straight. Resistance band comes across collarbones, hands toward floor. Hold momentarily. Slowly return to starting position. Repeat 10___ times. Band color _yellow____   Sash   On back, knees bent, feet flat, left hand on left hip, right hand above left. Pull right arm DIAGONALLY (hip to shoulder) across chest. Bring right arm along head toward floor. Hold momentarily. Slowly return to starting position. Repeat 10___ times. Do with left arm. Band color __yellow____   Shoulder Rotation: Double Arm   On back, knees bent, feet flat, elbows tucked at sides, bent 90, hands palms up. Pull hands apart and down toward floor, keeping elbows near sides. Hold momentarily. Slowly return to starting position. Repeat _10__ times. Band color ____yellow__     Sleeping on Back  Place pillow under knees. A pillow with cervical support and a roll around waist are also helpful. Copyright  VHI. All rights reserved.  Sleeping on Side Place pillow between knees. Use cervical support under neck and a roll around waist as needed. Copyright  VHI. All rights reserved.   Sleeping on Stomach   If this is the only desirable sleeping position, place pillow under lower legs, and under stomach or chest as needed.  Posture - Sitting   Sit upright, head facing forward. Try using a roll to support lower back. Keep shoulders relaxed, and avoid rounded back. Keep hips level with knees. Avoid crossing legs  for long periods. Stand to Sit / Sit to Stand   To sit: Bend knees to lower self onto front edge of chair, then scoot back on seat. To stand: Reverse sequence by placing one foot forward, and scoot to front of seat. Use rocking motion to stand up.   Work Height and Reach  Ideal work height is no more than 2 to 4 inches below elbow level when standing, and at elbow level when sitting. Reaching should be limited to arm's length, with elbows slightly bent.  Bending  Bend at hips and knees, not back. Keep feet shoulder-width apart.    Posture - Standing   Good posture is important. Avoid slouching and forward head thrust. Maintain curve in low back and align ears over shoul- ders, hips over ankles.  Alternating Positions   Alternate tasks and change positions frequently to reduce fatigue and muscle tension. Take rest breaks. Computer Work   Position work to Programmer, multimedia. Use proper work and seat height. Keep shoulders back and down, wrists straight, and elbows at right angles. Use chair that provides full back support. Add footrest and lumbar roll as needed.  Getting Into / Out of Car  Lower self onto seat, scoot back, then bring in one leg at a time. Reverse sequence to get out.  Dressing  Lie on back to pull socks or slacks over feet, or sit and bend leg while keeping back straight.    Housework - Sink  Place one foot on ledge of cabinet under sink when standing at sink for prolonged  periods.   Pushing / Pulling  Pushing is preferable to pulling. Keep back in proper alignment, and use leg muscles to do the work.  Deep Squat   Squat and lift with both arms held against upper trunk. Tighten stomach muscles without holding breath. Use smooth movements to avoid jerking.  Avoid Twisting   Avoid twisting or bending back. Pivot around using foot movements, and bend at knees if needed when reaching for articles.  Carrying Luggage   Distribute weight evenly on both  sides. Use a cart whenever possible. Do not twist trunk. Move body as a unit.   Lifting Principles .Maintain proper posture and head alignment. .Slide object as close as possible before lifting. .Move obstacles out of the way. .Test before lifting; ask for help if too heavy. .Tighten stomach muscles without holding breath. .Use smooth movements; do not jerk. .Use legs to do the work, and pivot with feet. .Distribute the work load symmetrically and close to the center of trunk. .Push instead of pull whenever possible.   Ask For Help   Ask for help and delegate to others when possible. Coordinate your movements when lifting together, and maintain the low back curve.  Log Roll   Lying on back, bend left knee and place left arm across chest. Roll all in one movement to the right. Reverse to roll to the left. Always move as one unit. Housework - Sweeping  Use long-handled equipment to avoid stooping.   Housework - Wiping  Position yourself as close as possible to reach work surface. Avoid straining your back.  Laundry - Unloading Wash   To unload small items at bottom of washer, lift leg opposite to arm being used to reach.  [place] close to area to be raked. Use arm movements to do the work. Keep back straight and avoid twisting.     Cart  When reaching into cart with one arm, lift opposite leg to keep back straight.   Getting Into / Out of Bed  Lower self to lie down on one side by raising legs and lowering head at the same time. Use arms to assist moving without twisting. Bend both knees to roll onto back if desired. To sit up, start from lying on side, and use same move-ments in reverse. Housework - Vacuuming  Hold the vacuum with arm held at side. Step back and forth to move it, keeping head up. Avoid twisting.   Laundry - IT consultant so that bending and twisting can be avoided.   Laundry - Unloading Dryer  Squat  down to reach into clothes dryer or use a reacher.  Gardening - Weeding / Probation officer or Kneel. Knee pads may be helpful.

## 2016-09-22 NOTE — Therapy (Signed)
[place], [place], [address] Phone: [phone]   Fax:  [phone]  Physical Therapy Treatment  Patient Details  Name: Misty Baird MRN: [id number] Date of Birth: 03/10/1951 Referring Provider: Billey Gosling MD  Encounter Date: 09/22/2016      PT End of Session - 09/22/16 1720    Visit Number 3   Number of Visits 13   Date for PT Re-Evaluation 10/13/16   PT Start Time 2952   PT Stop Time 8413   PT Time Calculation (min) 41 min   Activity Tolerance Patient tolerated treatment well;No increased pain   Behavior During Therapy WFL for tasks assessed/performed      Past Medical History:  Diagnosis Date  . Osteoporosis    R Hip    Past Surgical History:  Procedure Laterality Date  . CESAREAN SECTION     38 yrs ago    There were no vitals filed for this visit.      Subjective Assessment - 09/22/16 1634    Subjective Pain is almost gone except with carrying grandchild over the weekend. Mid scapular area. I have no pain at work. Pain is mild, brief.  It is sore.  Burning pain has resolved.  Pain is better with PT/ exercises.                           [facility] Adult PT Treatment/Exercise - 09/22/16 0001      Posture/Postural Control   Posture Comments practiced sitting with lumbar support and feet on a 4 inch platform.     Self-Care   Self-Care ADL's;Posture   ADL's reviewed ADL handout , demonstrated some modifications for patient,  for the most part she uses good techniques at home.  She has moved her PC around at work to be more ergonomic however her feet dsangle.  She plans to get someone at work to  build her a foot rest.      Therapeutic Activites    Therapeutic Activities Lifting   Lifting 8, 13, 18 pounds from waist to floor,  brief carries practiced with cues , good technique,      Shoulder Exercises: Supine   Other Supine Exercises supine scapular stabilization series  10 X each.  Yellow band issued,  red band too hard.  Narrow grip flexion, Sash, ER and shoulder horizontal pulll .  10 X each,  cues intermittantly.                  PT Education - 09/22/16 1720    Education provided Yes   Education Details exercises, ADL and lifting, posture   Person(s) Educated Patient   Methods Explanation;Demonstration;Tactile cues;Verbal cues;Handout   Comprehension Verbalized understanding;Returned demonstration          PT Short Term Goals - 09/22/16 1731      PT SHORT TERM GOAL #1   Title pt will be I with inital HEP ( 09/22/2016)   Time 3   Period Weeks   Status On-going           PT Long Term Goals - 09/22/16 1732      PT LONG TERM GOAL #1   Title pt will be I with all HEP given as of last visit (10/13/2016)   Time 6   Period Weeks   Status On-going     PT LONG TERM GOAL #2   Title pt will be able to verbalize and  demo proper lifting and carrying mechanics and techniques pt prevent and reduce shoulder pain (10/13/2016)   Baseline able to lift and carry 18 pounds in clinic with correct posture and no pain.    Time 6   Period Weeks   Status On-going     PT LONG TERM GOAL #3   Title pt will be able to lift/ carry >/= 25# from floor <> waist and from  waist<> shoulder height with </= 2/10 pain utilizing proper form for functional lifting and ADLS (75/44/9201)   Baseline able to lift and carry 18 pounds from floor to waist correctly with no pain   Time 6   Period Weeks   Status Partially Met     PT LONG TERM GOAL #4   Title pt will report no burning or radiating pain inthe shoulder blade or L upper trap with sitting/ standing for >/= 30 min to assist with work and leisure activities (10/13/2016)   Baseline minor pain     Time 6   Period Weeks   Status On-going     PT LONG TERM GOAL #5   Title she will increaes her FOTO score to </= 24% limitation to demonstrate improvement in function at discharge (10/13/2016)   Time 6   Period  Weeks   Status Unable to assess               Plan - 09/22/16 1721    Clinical Impression Statement Misty Baird reports her pain is almost gone.  She has no pain at work.  Some mild pain with carrying her grandchild.  She now has him climb up onto something so she does not lift.  LTG#3 partially met.   PT Next Visit Plan scapular stability,  posture education, lifting/ carrying mechanics, thoracic mobility, shoulder strengthening (hx osteoporosis)   PT Home Exercise Plan seated pelvic tilts, book opening, rhomboid stretch  (09/22/2016) Supine scapular stabilization series, yellow band   Consulted and Agree with Plan of Care Patient      Patient will benefit from skilled therapeutic intervention in order to improve the following deficits and impairments:  Improper body mechanics, Postural dysfunction, Pain, Decreased activity tolerance, Increased fascial restricitons, Impaired UE functional use, Decreased endurance  Visit Diagnosis: Chronic left shoulder pain  Abnormal posture     Problem List Patient Active Problem List   Diagnosis Date Noted  . Mid back pain on left side 08/19/2016  . Cervical disc disorder with radiculopathy of cervical region 08/19/2016  . 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 Osteoporosis 04/04/2016    Misty Baird  PTA 09/22/2016, 5:43 PM  [facility] [address] [place], [place], [address] Phone: [phone]   Fax:  [phone]  Name: Misty Baird MRN: [id number] Date of Birth: 10-20-1951

## 2016-09-29 ENCOUNTER — Ambulatory Visit: Payer: BLUE CROSS/BLUE SHIELD | Admitting: Physical Therapy

## 2016-09-29 DIAGNOSIS — M25512 Pain in left shoulder: Principal | ICD-10-CM

## 2016-09-29 DIAGNOSIS — G8929 Other chronic pain: Secondary | ICD-10-CM

## 2016-09-29 DIAGNOSIS — R293 Abnormal posture: Secondary | ICD-10-CM

## 2016-09-29 NOTE — Therapy (Signed)
[place], [place], [address] Phone: [phone]   Fax:  [phone]  Physical Therapy Treatment  Patient Details  Name: Misty Baird MRN: [id number] Date of Birth: 20-Mar-1951 Referring Provider: Billey Gosling MD  Encounter Date: 09/29/2016      PT End of Session - 09/29/16 1638    Visit Number 4   Number of Visits 13   Date for PT Re-Evaluation 10/13/16   Authorization Type BC[employer]   PT Start Time 1633   PT Stop Time 1720   PT Time Calculation (min) 47 min   Activity Tolerance Patient tolerated treatment well   Behavior During Therapy [facility] for tasks assessed/performed      Past Medical History:  Diagnosis Date  . Osteoporosis    R Hip    Past Surgical History:  Procedure Laterality Date  . CESAREAN SECTION     38 yrs ago    There were no vitals filed for this visit.      Subjective Assessment - 09/29/16 1634    Subjective "no pain, haven't had any pain in a while"   Currently in Pain? No/denies                         [facility] Adult PT Treatment/Exercise - 09/29/16 1636      Shoulder Exercises: Seated   Other Seated Exercises Lower trap strengthening 2 x 15  with yellow theraband, given as HEP     Shoulder Exercises: Standing   Protraction Strengthening;Both;15 reps  2 sets from wall, given as HEP   Other Standing Exercises lifting from lower shelf to simulate picking up child 1 x 10 18#, `1 x 10 28#  pt reported heavy but no pain in the shoulders or back     Shoulder Exercises: ROM/Strengthening   UBE (Upper Arm Bike) L2 x 6 min  changing direction 3 min     Shoulder Exercises: Stretch   Other Shoulder Stretches upper trap 2 x 30 sec, levator 2 x 30 sec   Other Shoulder Stretches rhomboid stretching 2 x 30 sec                PT Education - 09/29/16 1727    Education provided Yes   Education Details updated HEP with form/ rationale   Person(s) Educated  Patient   Methods Explanation;Demonstration;Verbal cues;Handout   Comprehension Verbalized understanding;Returned demonstration;Verbal cues required          PT Short Term Goals - 09/29/16 1729      PT SHORT TERM GOAL #1   Title pt will be I with inital HEP ( 09/22/2016)   Time 3   Period Weeks   Status Achieved           PT Long Term Goals - 09/29/16 1730      PT LONG TERM GOAL #1   Title pt will be I with all HEP given as of last visit (10/13/2016)   Time 6   Period Weeks   Status On-going     PT LONG TERM GOAL #2   Title pt will be able to verbalize and demo proper lifting and carrying mechanics and techniques pt prevent and reduce shoulder pain (10/13/2016)   Time 6   Period Weeks   Status On-going     PT LONG TERM GOAL #3   Title pt will be able to lift/ carry >/= 25# from floor <> waist and from  waist<>  shoulder height with </= 2/10 pain utilizing proper form for functional lifting and ADLS ([id number])   Time 6   Period Weeks   Status Achieved     PT LONG TERM GOAL #4   Title pt will report no burning or radiating pain inthe shoulder blade or L upper trap with sitting/ standing for >/= 30 min to assist with work and leisure activities (10/13/2016)   Time 6   Period Weeks   Status On-going     PT LONG TERM GOAL #5   Title she will increaes her FOTO score to </= 24% limitation to demonstrate improvement in function at discharge (10/13/2016)   Time 6   Period Weeks   Status On-going               Plan - 09/29/16 1727    Clinical Impression Statement pt continues to progress with PT reporting 0/10 pain for a while. continued focus on scapular stabilizers and thoracic mobility which she performed well with. she was able to perform lifting activities simulating grandchildren weight of 28# and reported no pain.    PT Next Visit Plan scapular stability,  posture education, lifting/ carrying mechanics, thoracic mobility, shoulder strengthening (hx  osteoporosis)   PT Home Exercise Plan seated pelvic tilts, book opening, rhomboid stretch  (09/22/2016) Supine scapular stabilization series, yellow band, lower trap strengthening, wall-push ups   Consulted and Agree with Plan of Care Patient      Patient will benefit from skilled therapeutic intervention in order to improve the following deficits and impairments:  Improper body mechanics, Postural dysfunction, Pain, Decreased activity tolerance, Increased fascial restricitons, Impaired UE functional use, Decreased endurance  Visit Diagnosis: Chronic left shoulder pain  Abnormal posture     Problem List Patient Active Problem List   Diagnosis Date Noted  . Mid back pain on left side 08/19/2016  . Cervical disc disorder with radiculopathy of cervical region 08/19/2016  . 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 Osteoporosis 04/04/2016   Starr Lake PT, DPT, LAT, ATC  09/29/16  5:36 PM      [place] [facility] [address] [place], [place], [address] Phone: [phone]   Fax:  [phone]  Name: Misty Baird MRN: [id number] Date of Birth: 1951/05/06

## 2016-09-29 NOTE — Patient Instructions (Signed)
  You can put your elbows on your knees or the door 2 x 10 using the red theraband

## 2016-10-06 ENCOUNTER — Ambulatory Visit: Payer: BLUE CROSS/BLUE SHIELD | Attending: Internal Medicine | Admitting: Physical Therapy

## 2016-10-06 DIAGNOSIS — M25512 Pain in left shoulder: Secondary | ICD-10-CM | POA: Diagnosis present

## 2016-10-06 DIAGNOSIS — G8929 Other chronic pain: Secondary | ICD-10-CM | POA: Diagnosis present

## 2016-10-06 DIAGNOSIS — R293 Abnormal posture: Secondary | ICD-10-CM | POA: Insufficient documentation

## 2016-10-06 NOTE — Therapy (Signed)
[place], [place], [address] Phone: [phone]   Fax:  [phone]  Physical Therapy Treatment / Discharge Note  Patient Details  Name: Misty Baird MRN: [id number] Date of Birth: 1951/10/06 Referring Provider: Billey Gosling MD  Encounter Date: 10/06/2016      PT End of Session - 10/06/16 1736    Visit Number 5   Number of Visits 13   Date for PT Re-Evaluation 10/13/16   PT Start Time 2637   PT Stop Time 1713   PT Time Calculation (min) 42 min   Activity Tolerance Patient tolerated treatment well   Behavior During Therapy [facility] for tasks assessed/performed      Past Medical History:  Diagnosis Date  . Osteoporosis    R Hip    Past Surgical History:  Procedure Laterality Date  . CESAREAN SECTION     38 yrs ago    There were no vitals filed for this visit.      Subjective Assessment - 10/06/16 1636    Subjective "no pain, haven been doing pretty good"   Currently in Pain? No/denies            [facility] PT Assessment - 10/06/16 0001      AROM   Right Shoulder Extension 64 Degrees   Right Shoulder Flexion 164 Degrees   Right Shoulder ABduction 158 Degrees   Right Shoulder Internal Rotation 74 Degrees  assessed at 90/90   Right Shoulder External Rotation 92 Degrees  assessed at 90/90   Left Shoulder Extension 56 Degrees   Left Shoulder Flexion 170 Degrees   Left Shoulder ABduction 170 Degrees   Left Shoulder Internal Rotation 72 Degrees   Left Shoulder External Rotation 90 Degrees   Cervical Flexion 62   Cervical Extension 52   Cervical - Right Side Bend 34   Cervical - Left Side Bend 40   Cervical - Right Rotation 73   Cervical - Left Rotation 67     Strength   Right Shoulder Flexion 4+/5   Right Shoulder Extension 5/5   Right Shoulder ABduction 4+/5   Right Shoulder Internal Rotation 4+/5   Right Shoulder External Rotation 4+/5   Left Shoulder Flexion 4+/5   Left Shoulder  Extension 5/5   Left Shoulder ABduction 4+/5   Left Shoulder Internal Rotation 4+/5   Left Shoulder External Rotation 4+/5   Left Hand Grip (lbs) 37.6  38,37,38                     [facility] Adult PT Treatment/Exercise - 10/06/16 1639      Shoulder Exercises: Seated   Other Seated Exercises Lower trap strengthening 2 x 15     Shoulder Exercises: Standing   Protraction Strengthening;Both;15 reps     Shoulder Exercises: ROM/Strengthening   UBE (Upper Arm Bike) L2 x 8 min, sprinting the last 10 sec of every min  changing direction at 4 min     Shoulder Exercises: Stretch   Other Shoulder Stretches upper trap 2 x 30 sec, levator 2 x 30 sec   Other Shoulder Stretches rhomboid stretching 2 x 30 sec                PT Education - 10/06/16 1732    Education provided Yes   Education Details reviewed HEP and how to progess endurance increasing reps and sets.   Person(s) Educated Patient   Methods Explanation;Verbal cues;Handout   Comprehension Verbalized understanding;Verbal cues required  PT Short Term Goals - 09/29/16 1729      PT SHORT TERM GOAL #1   Title pt will be I with inital HEP ( 09/22/2016)   Time 3   Period Weeks   Status Achieved           PT Long Term Goals - 10/06/16 1706      PT LONG TERM GOAL #1   Title pt will be I with all HEP given as of last visit (10/13/2016)   Time 6   Period Weeks   Status Achieved     PT LONG TERM GOAL #2   Title pt will be able to verbalize and demo proper lifting and carrying mechanics and techniques pt prevent and reduce shoulder pain (10/13/2016)   Baseline able to lift 28# with good form   Time 6   Period Weeks   Status Achieved     PT LONG TERM GOAL #3   Title pt will be able to lift/ carry >/= 25# from floor <> waist and from  waist<> shoulder height with </= 2/10 pain utilizing proper form for functional lifting and ADLS (69/62/9528)   Baseline lifted 28#   Time 6   Period Weeks    Status Achieved     PT LONG TERM GOAL #4   Title pt will report no burning or radiating pain inthe shoulder blade or L upper trap with sitting/ standing for >/= 30 min to assist with work and leisure activities (10/13/2016)   Time 6   Period Weeks   Status Achieved     PT LONG TERM GOAL #5   Title she will increaes her FOTO score to </= 24% limitation to demonstrate improvement in function at discharge (10/13/2016)   Time 6   Period Weeks   Status Partially Met               Plan - 10/06/16 1737    Clinical Impression Statement [name] reports no pain and has improved cervical and bil shoulder AROM and strength. She has met or partially met all goals this visit. She is able to maintain and progress her current level of function independently and will be discharged from PT today.     PT Next Visit Plan discharged from PT   Consulted and Agree with Plan of Care Patient      Patient will benefit from skilled therapeutic intervention in order to improve the following deficits and impairments:  Improper body mechanics, Postural dysfunction, Pain, Decreased activity tolerance, Increased fascial restricitons, Impaired UE functional use, Decreased endurance  Visit Diagnosis: Chronic left shoulder pain  Abnormal posture     Problem List Patient Active Problem List   Diagnosis Date Noted  . Mid back pain on left side 08/19/2016  . Cervical disc disorder with radiculopathy of cervical region 08/19/2016  . 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 Osteoporosis 04/04/2016   Starr Lake PT, DPT, LAT, ATC  10/06/16  5:43 PM       [place] [address] [place], [place], [address] Phone: [phone]   Fax:  [phone]  Name: Misty Baird MRN: [id number] Date of Birth: 1951/05/03   PHYSICAL THERAPY DISCHARGE SUMMARY  Visits from Start of Care: 5  Current functional level related to goals / functional  outcomes: See goals   Remaining deficits: N/A   Education / Equipment: HEP, posture, theraband, lifting/ carrying mechanics,   Plan: Patient agrees to discharge.  Patient goals were not  met. Patient is being discharged due to meeting the stated rehab goals.  ?????

## 2016-12-24 IMAGING — DX DG CERVICAL SPINE 2 OR 3 VIEWS
3 series · 3 of 3 positions shown · non-contrast
Comparison: None.

CLINICAL DATA: Neck pain

EXAM:
CERVICAL SPINE - 2-3 VIEW

[c-spine lat]
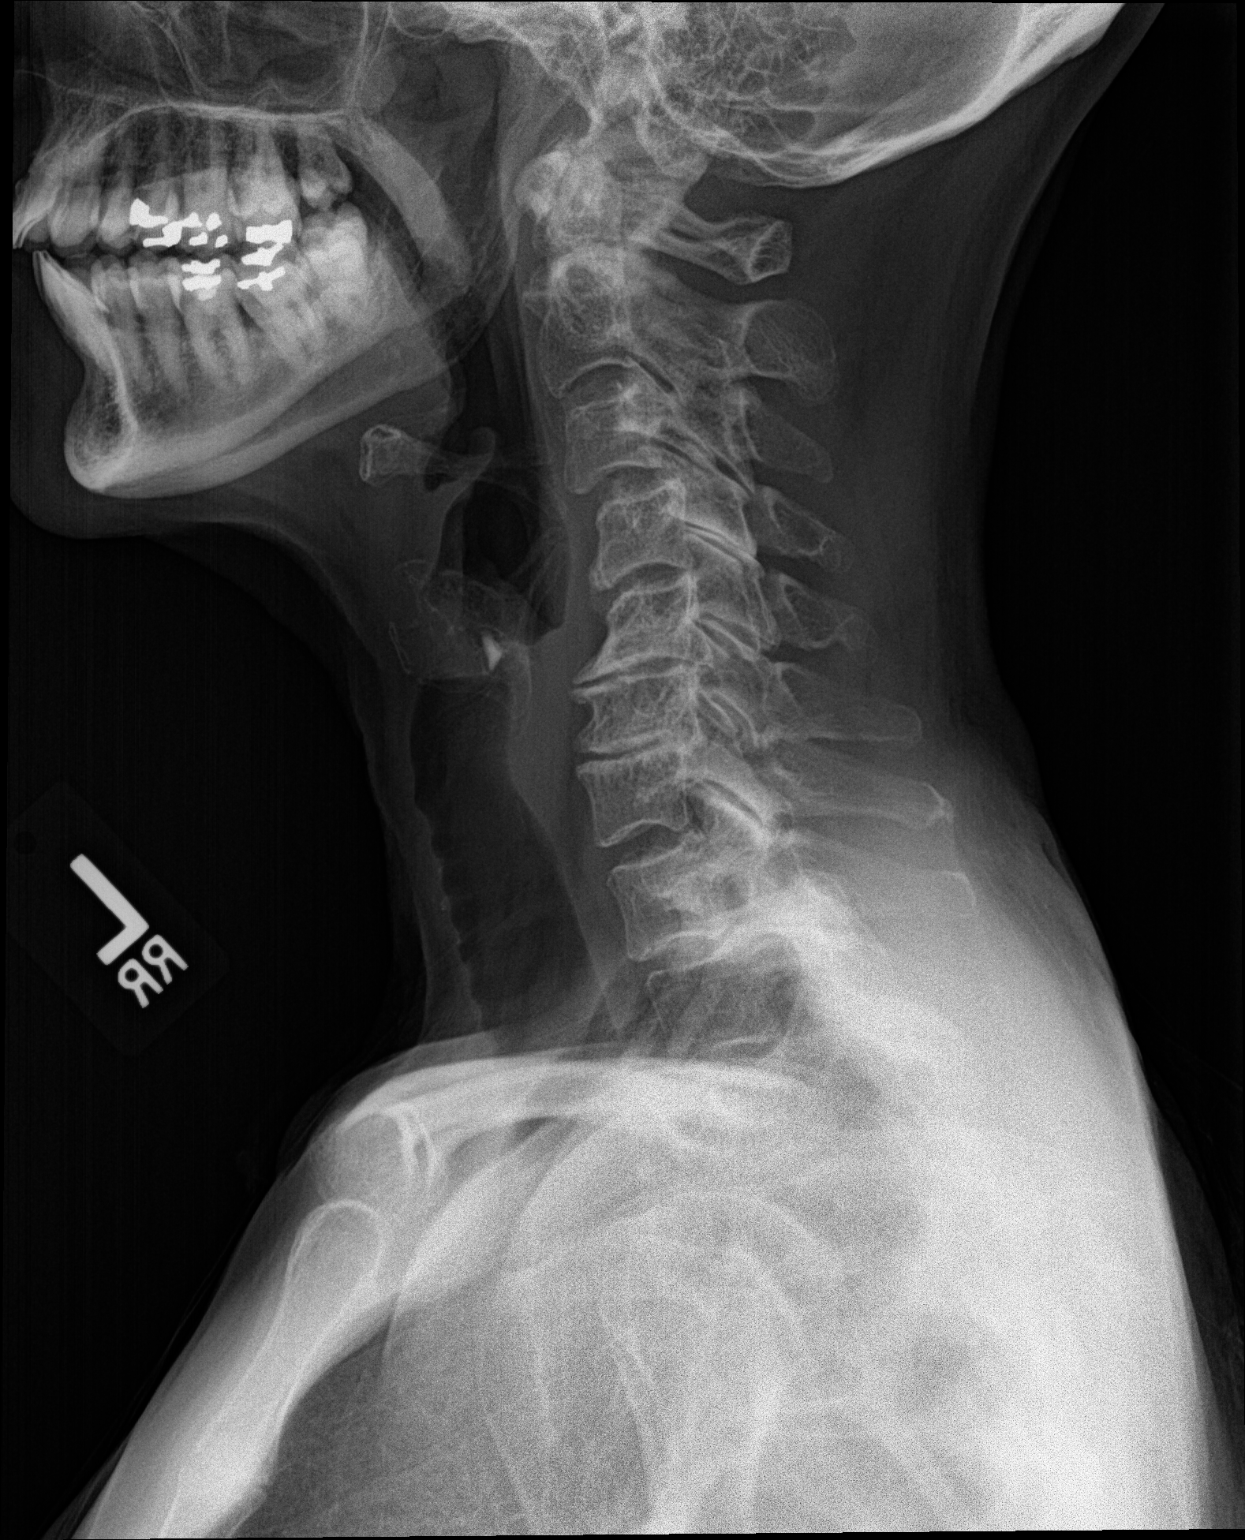

[c-spine ap]
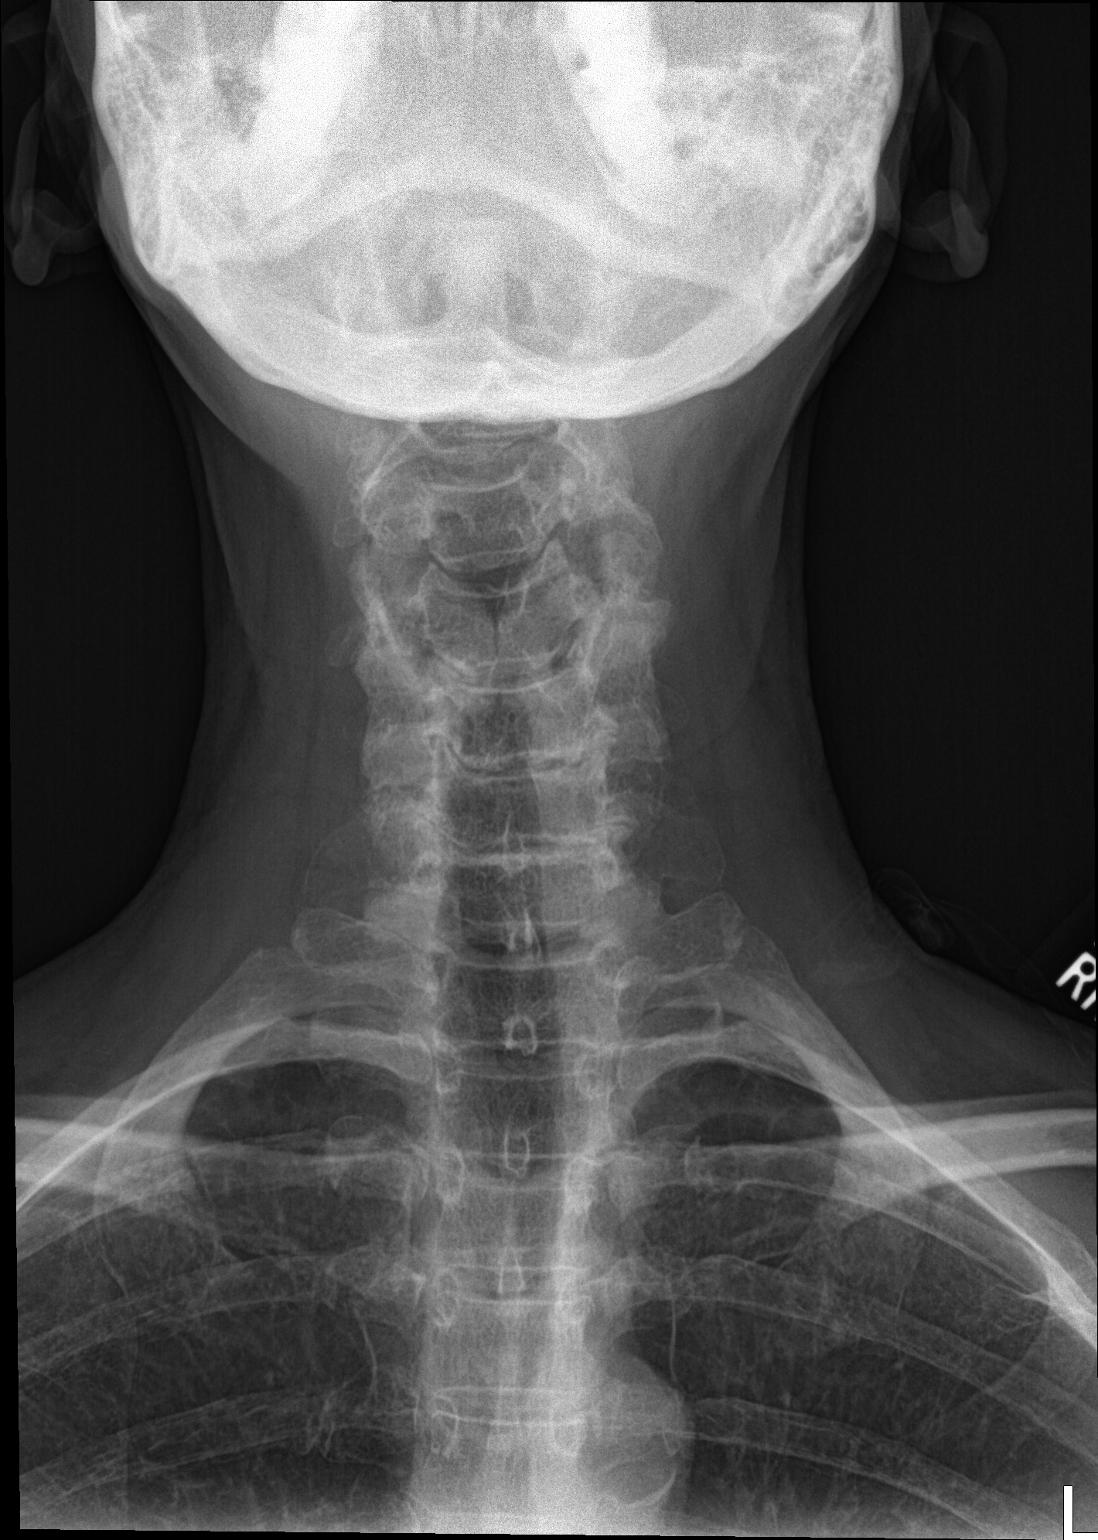

[c-spine open mouth]
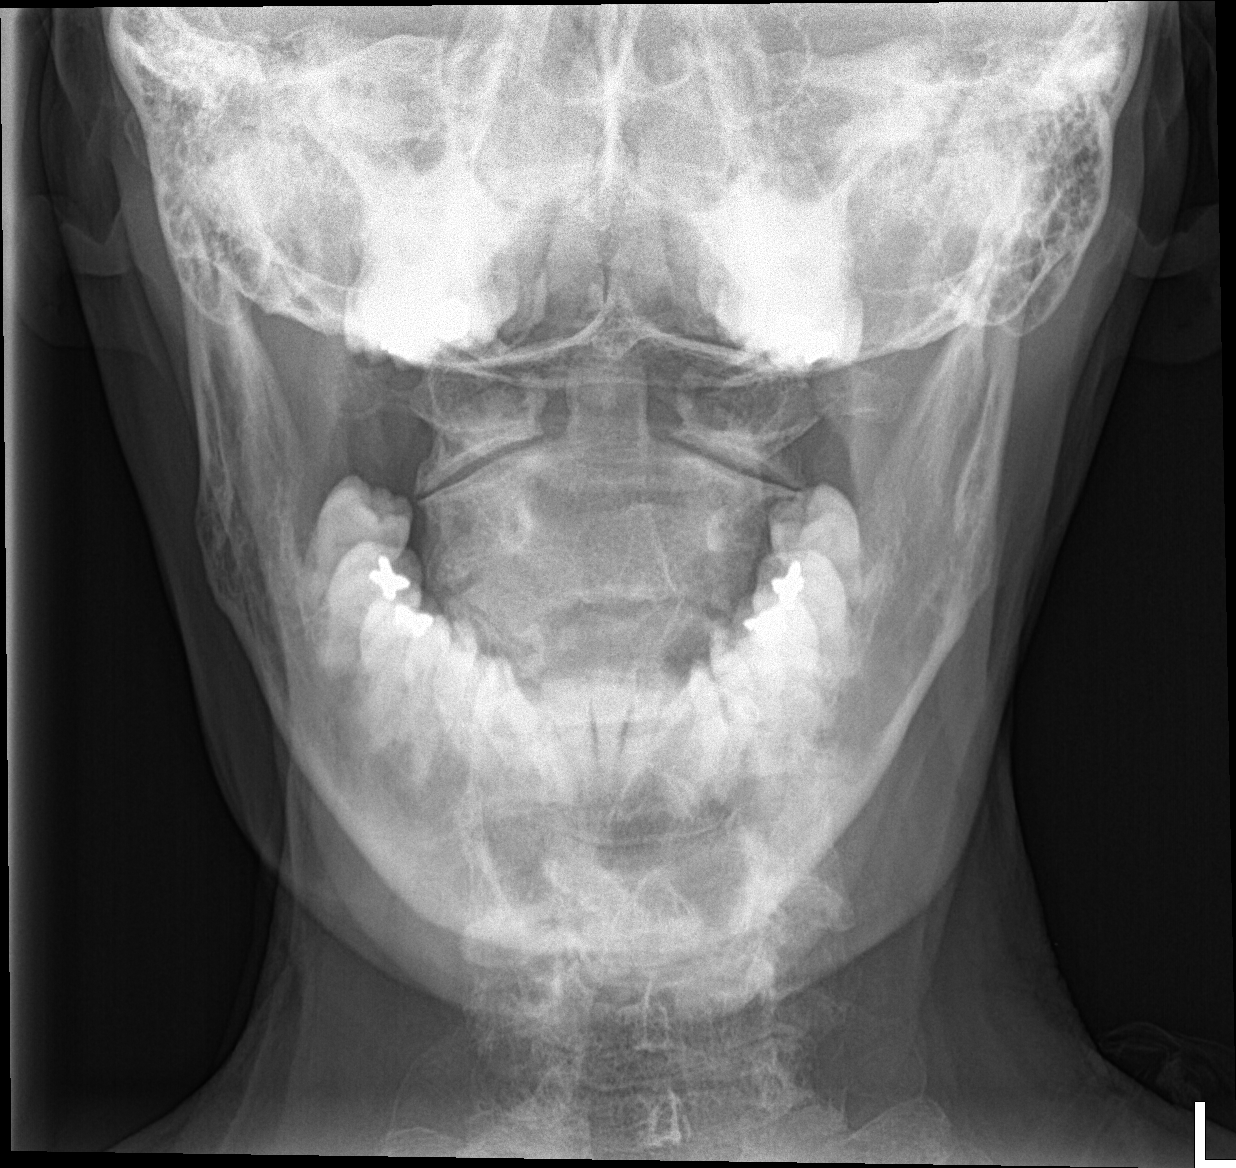

[3 of 3 positions shown; findings below may reference images not displayed]

FINDINGS: Degenerative disc disease from C4-5 thru C6-7, most pronounced at
C5-6 and C6-7 with disc space narrowing and spurring. Degenerative
facet disease bilaterally. Degenerative anterolisthesis of C3 on C4
of 3 mm. No fracture. Prevertebral soft tissues are normal.
IMPRESSION: Degenerative changes as above.  No acute findings.

## 2016-12-24 IMAGING — DX DG CHEST 2V
2 series · 2 of 2 positions shown · non-contrast
Comparison: None.

CLINICAL DATA: Left-sided chest pain.

EXAM:
CHEST  2 VIEW

[chest pa]
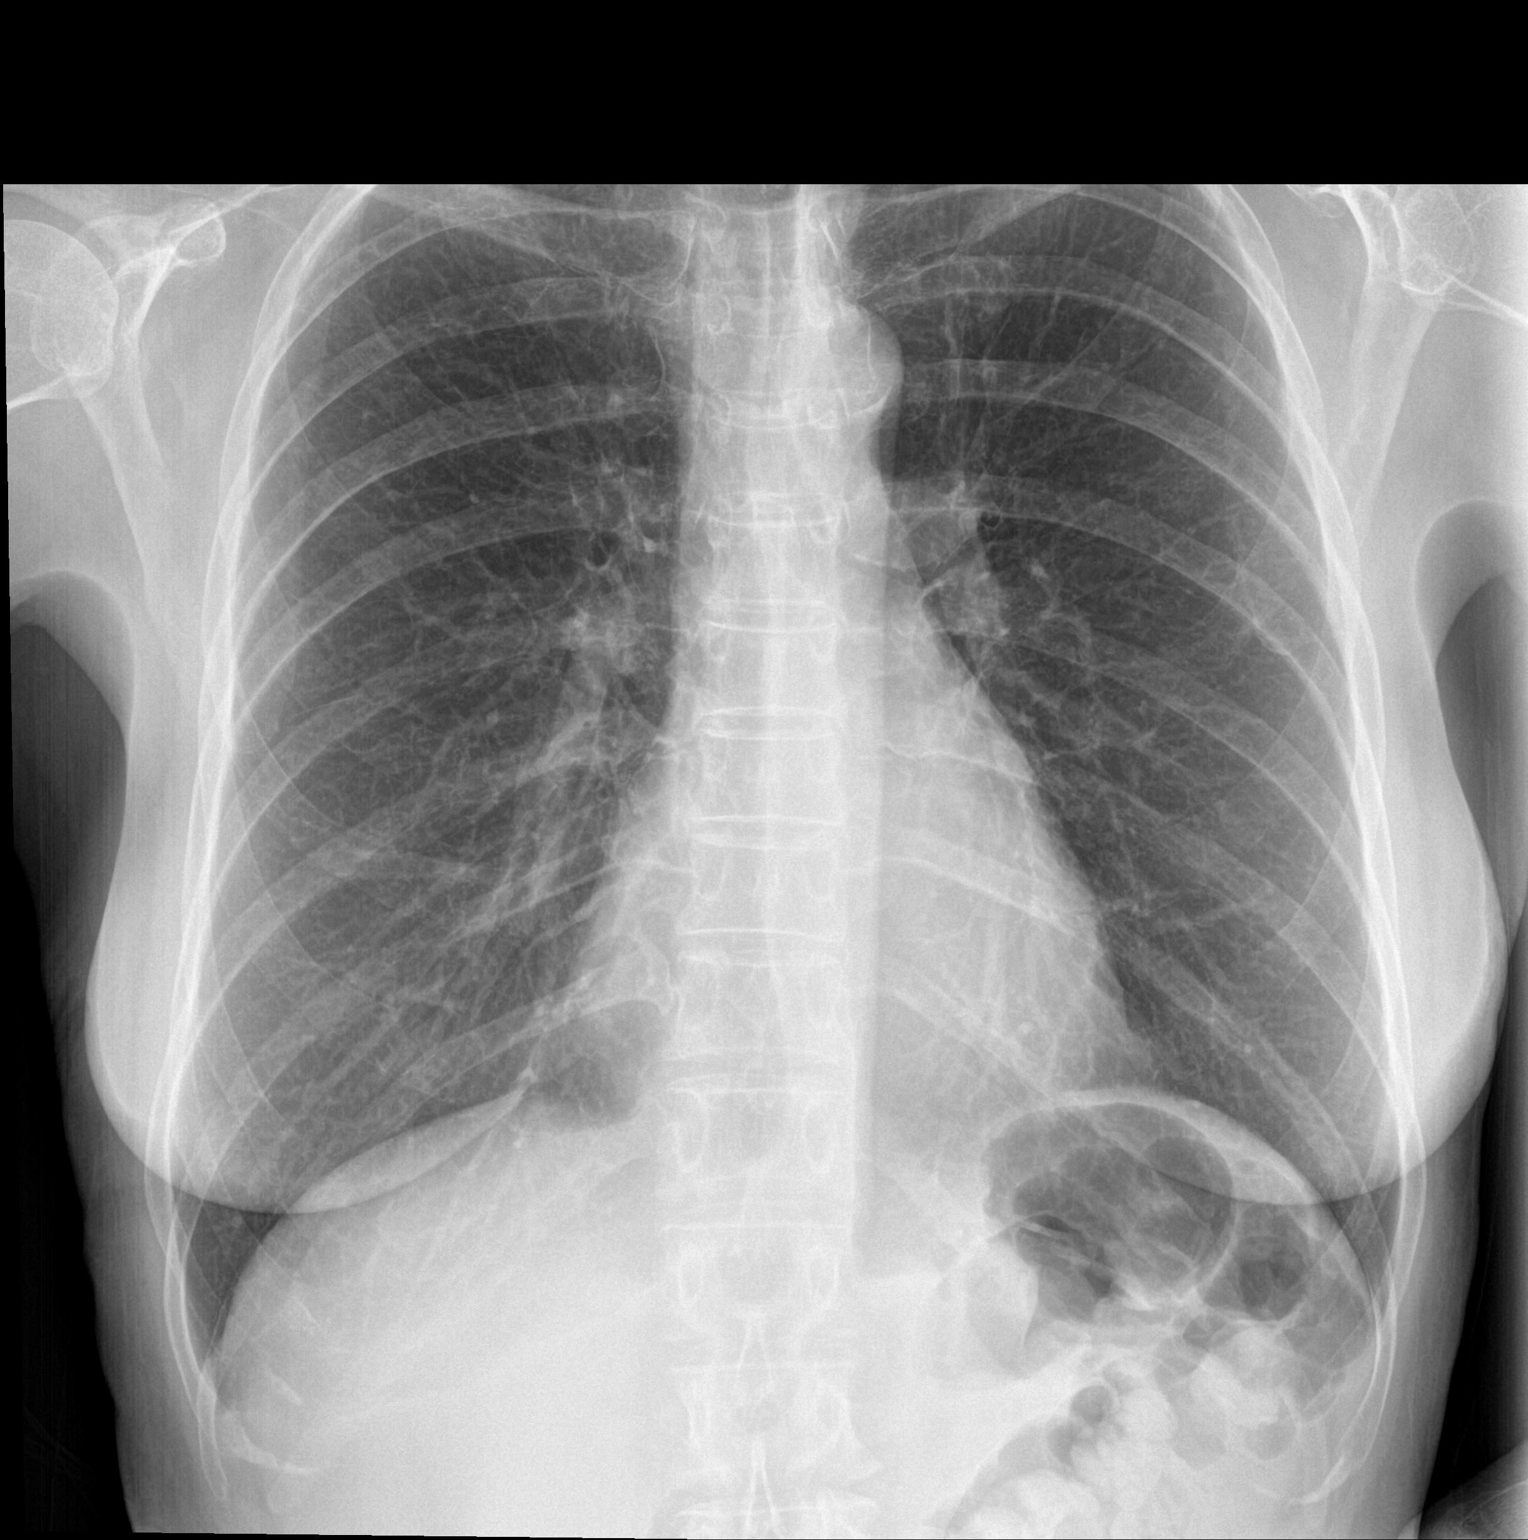

[chest lat]
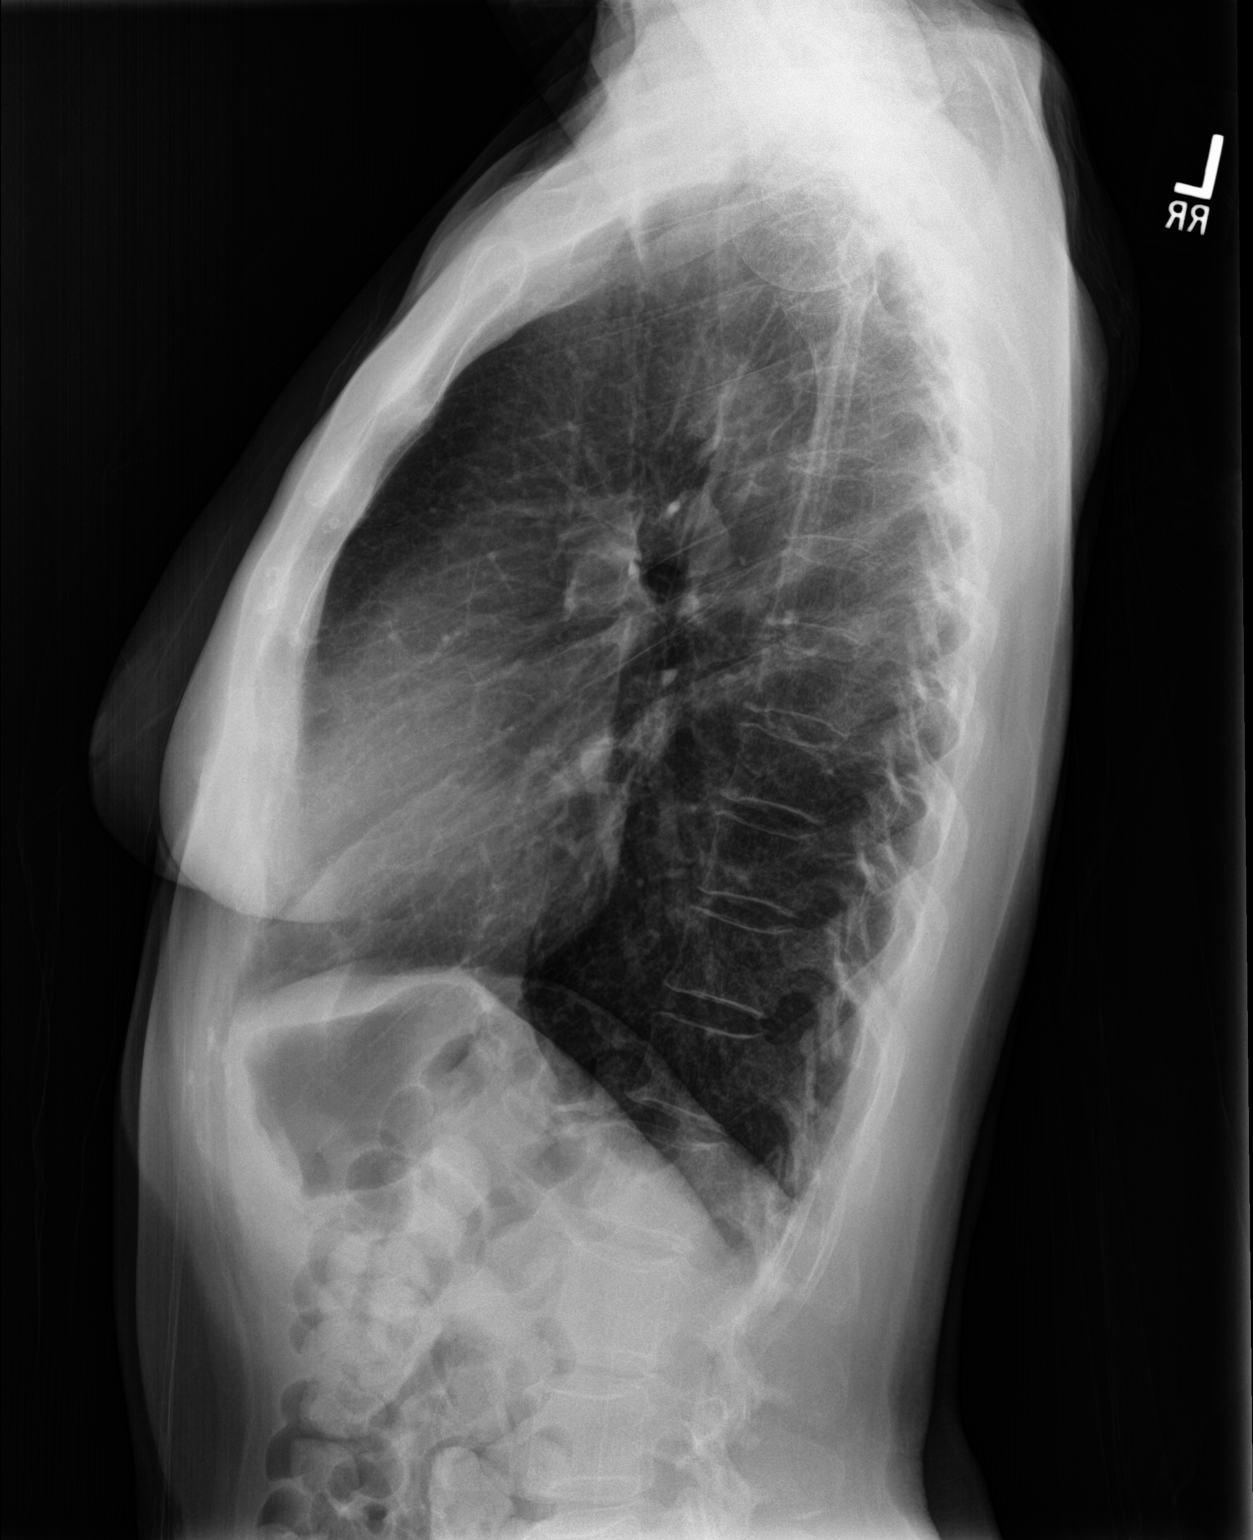

[2 of 2 positions shown; findings below may reference images not displayed]

FINDINGS: The heart size and mediastinal contours are within normal limits.
Both lungs are clear. No pneumothorax or pleural effusion is noted.
The visualized skeletal structures are unremarkable.
IMPRESSION: No active cardiopulmonary disease.

## 2020-03-08 ENCOUNTER — Ambulatory Visit: Payer: BC Managed Care – PPO | Attending: Internal Medicine

## 2020-03-08 DIAGNOSIS — Z23 Encounter for immunization: Secondary | ICD-10-CM

## 2020-03-08 NOTE — Progress Notes (Signed)
   Covid-19 Vaccination Clinic  Name:  Hasana Buroker    MRN: [id number] DOB: 1951-05-25  03/08/2020  Ms. Rementer was observed post Covid-19 immunization for 15 minutes without incident. She was provided with Vaccine Information Sheet and instruction to access the V-Safe system.   Ms. Bagot was instructed to call 911 with any severe reactions post vaccine: Marland Kitchen Difficulty breathing  . Swelling of face and throat  . A fast heartbeat  . A bad rash all over body  . Dizziness and weakness   Immunizations Administered    Name Date Dose VIS Date Route   Pfizer COVID-19 Vaccine 03/08/2020 10:39 AM 0.3 mL 11/11/2019 Intramuscular   Manufacturer: [place]   Lot: [id number]   [place]: [id number]

## 2020-04-02 ENCOUNTER — Ambulatory Visit: Payer: BC Managed Care – PPO | Attending: Internal Medicine

## 2020-04-02 ENCOUNTER — Ambulatory Visit: Payer: BLUE CROSS/BLUE SHIELD

## 2020-04-02 DIAGNOSIS — Z23 Encounter for immunization: Secondary | ICD-10-CM

## 2020-04-02 NOTE — Progress Notes (Signed)
   Covid-19 Vaccination Clinic  Name:  Misty Baird    MRN: [id number] DOB: 1951/10/11  04/02/2020  Misty Baird was observed post Covid-19 immunization for 15 minutes without incident. She was provided with Vaccine Information Sheet and instruction to access the V-Safe system.   Misty Baird was instructed to call 911 with any severe reactions post vaccine: Marland Kitchen Difficulty breathing  . Swelling of face and throat  . A fast heartbeat  . A bad rash all over body  . Dizziness and weakness   Immunizations Administered    Name Date Dose VIS Date Route   Pfizer COVID-19 Vaccine 04/02/2020 10:50 AM 0.3 mL 01/25/2019 Intramuscular   Manufacturer: [place]   Lot: [id number]   [place]: [id number]

## 2020-04-04 ENCOUNTER — Ambulatory Visit: Payer: BC Managed Care – PPO

## 2020-10-13 ENCOUNTER — Other Ambulatory Visit: Payer: Self-pay

## 2020-10-13 ENCOUNTER — Ambulatory Visit: Payer: Self-pay | Attending: Internal Medicine

## 2020-10-13 DIAGNOSIS — Z23 Encounter for immunization: Secondary | ICD-10-CM

## 2020-10-13 NOTE — Progress Notes (Signed)
   Covid-19 Vaccination Clinic  Name:  Misty Baird    MRN: [id number] DOB: Oct 27, 1951  10/13/2020  [name] was observed post Covid-19 immunization for 15 minutes without incident. She was provided with Vaccine Information Sheet and instruction to access the V-Safe system.   [name] was instructed to call 911 with any severe reactions post vaccine: Marland Kitchen Difficulty breathing  . Swelling of face and throat  . A fast heartbeat  . A bad rash all over body  . Dizziness and weakness   Immunizations Administered    Name Date Dose VIS Date Route   Pfizer COVID-19 Vaccine 10/13/2020  3:10 PM 0.3 mL 09/19/2020 Intramuscular   Manufacturer: [place]   Lot: [id number]   [place]: [id number]

## 2023-05-11 ENCOUNTER — Other Ambulatory Visit: Payer: Self-pay | Admitting: Family Medicine

## 2023-05-11 DIAGNOSIS — Z1231 Encounter for screening mammogram for malignant neoplasm of breast: Secondary | ICD-10-CM

## 2024-01-06 ENCOUNTER — Other Ambulatory Visit (HOSPITAL_BASED_OUTPATIENT_CLINIC_OR_DEPARTMENT_OTHER): Payer: Self-pay | Admitting: Physician Assistant

## 2024-01-06 DIAGNOSIS — E7849 Other hyperlipidemia: Secondary | ICD-10-CM

## 2024-01-25 ENCOUNTER — Ambulatory Visit (HOSPITAL_COMMUNITY)
Admission: RE | Admit: 2024-01-25 | Discharge: 2024-01-25 | Disposition: A | Discharge status: Home / Self Care | Payer: Self-pay | Source: Ambulatory Visit | Attending: Physician Assistant | Admitting: Physician Assistant

## 2024-01-25 DIAGNOSIS — E7849 Other hyperlipidemia: Secondary | ICD-10-CM | POA: Insufficient documentation

## 2024-02-29 DIAGNOSIS — L814 Other melanin hyperpigmentation: Secondary | ICD-10-CM | POA: Diagnosis not present

## 2024-02-29 DIAGNOSIS — L570 Actinic keratosis: Secondary | ICD-10-CM | POA: Diagnosis not present

## 2024-02-29 DIAGNOSIS — L578 Other skin changes due to chronic exposure to nonionizing radiation: Secondary | ICD-10-CM | POA: Diagnosis not present

## 2024-02-29 DIAGNOSIS — D2271 Melanocytic nevi of right lower limb, including hip: Secondary | ICD-10-CM | POA: Diagnosis not present

## 2024-02-29 DIAGNOSIS — L821 Other seborrheic keratosis: Secondary | ICD-10-CM | POA: Diagnosis not present

## 2024-02-29 DIAGNOSIS — D1801 Hemangioma of skin and subcutaneous tissue: Secondary | ICD-10-CM | POA: Diagnosis not present

## 2024-02-29 DIAGNOSIS — Z85828 Personal history of other malignant neoplasm of skin: Secondary | ICD-10-CM | POA: Diagnosis not present

## 2024-03-01 DIAGNOSIS — F419 Anxiety disorder, unspecified: Secondary | ICD-10-CM | POA: Diagnosis not present

## 2024-03-30 DIAGNOSIS — E7849 Other hyperlipidemia: Secondary | ICD-10-CM | POA: Diagnosis not present

## 2024-03-30 DIAGNOSIS — F411 Generalized anxiety disorder: Secondary | ICD-10-CM | POA: Diagnosis not present

## 2024-05-17 DIAGNOSIS — H5213 Myopia, bilateral: Secondary | ICD-10-CM | POA: Diagnosis not present

## 2024-05-17 DIAGNOSIS — H35372 Puckering of macula, left eye: Secondary | ICD-10-CM | POA: Diagnosis not present

## 2024-05-17 DIAGNOSIS — H524 Presbyopia: Secondary | ICD-10-CM | POA: Diagnosis not present

## 2024-05-17 DIAGNOSIS — H401131 Primary open-angle glaucoma, bilateral, mild stage: Secondary | ICD-10-CM | POA: Diagnosis not present

## 2024-05-17 DIAGNOSIS — H40043 Steroid responder, bilateral: Secondary | ICD-10-CM | POA: Diagnosis not present

## 2024-05-17 DIAGNOSIS — H53432 Sector or arcuate defects, left eye: Secondary | ICD-10-CM | POA: Diagnosis not present

## 2024-06-01 DIAGNOSIS — F419 Anxiety disorder, unspecified: Secondary | ICD-10-CM | POA: Diagnosis not present

## 2024-06-01 DIAGNOSIS — E7849 Other hyperlipidemia: Secondary | ICD-10-CM | POA: Diagnosis not present

## 2024-09-01 DIAGNOSIS — L578 Other skin changes due to chronic exposure to nonionizing radiation: Secondary | ICD-10-CM | POA: Diagnosis not present

## 2024-09-01 DIAGNOSIS — D1801 Hemangioma of skin and subcutaneous tissue: Secondary | ICD-10-CM | POA: Diagnosis not present

## 2024-09-01 DIAGNOSIS — L570 Actinic keratosis: Secondary | ICD-10-CM | POA: Diagnosis not present

## 2024-09-01 DIAGNOSIS — L814 Other melanin hyperpigmentation: Secondary | ICD-10-CM | POA: Diagnosis not present

## 2024-09-01 DIAGNOSIS — L821 Other seborrheic keratosis: Secondary | ICD-10-CM | POA: Diagnosis not present

## 2024-09-01 DIAGNOSIS — L820 Inflamed seborrheic keratosis: Secondary | ICD-10-CM | POA: Diagnosis not present

## 2024-09-01 DIAGNOSIS — Z85828 Personal history of other malignant neoplasm of skin: Secondary | ICD-10-CM | POA: Diagnosis not present
